# Patient Record
Sex: Female | Born: 1945 | Race: White | Hispanic: No | Smoking: Former smoker
Health system: Southern US, Community
[De-identification: ages and names within clinical notes are randomized; demographics above are authoritative.]

## PROBLEM LIST (undated history)

## (undated) DIAGNOSIS — F329 Major depressive disorder, single episode, unspecified: Secondary | ICD-10-CM

## (undated) DIAGNOSIS — F32A Depression, unspecified: Secondary | ICD-10-CM

## (undated) DIAGNOSIS — T7840XA Allergy, unspecified, initial encounter: Secondary | ICD-10-CM

## (undated) DIAGNOSIS — K219 Gastro-esophageal reflux disease without esophagitis: Secondary | ICD-10-CM

## (undated) DIAGNOSIS — M1990 Unspecified osteoarthritis, unspecified site: Secondary | ICD-10-CM

## (undated) DIAGNOSIS — R55 Syncope and collapse: Secondary | ICD-10-CM

## (undated) DIAGNOSIS — F419 Anxiety disorder, unspecified: Secondary | ICD-10-CM

## (undated) HISTORY — PX: OTHER SURGICAL HISTORY: SHX169

## (undated) HISTORY — DX: Unspecified osteoarthritis, unspecified site: M19.90

## (undated) HISTORY — DX: Depression, unspecified: F32.A

## (undated) HISTORY — DX: Anxiety disorder, unspecified: F41.9

## (undated) HISTORY — DX: Allergy, unspecified, initial encounter: T78.40XA

## (undated) HISTORY — PX: TRABECULECTOMY: SHX107

## (undated) HISTORY — DX: Syncope and collapse: R55

## (undated) HISTORY — PX: ABDOMINAL HYSTERECTOMY: SHX81

## (undated) HISTORY — DX: Gastro-esophageal reflux disease without esophagitis: K21.9

## (undated) HISTORY — PX: COSMETIC SURGERY: SHX468

---
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
## 1975-01-31 HISTORY — PX: CHOLECYSTECTOMY: SHX55

## 1988-01-31 HISTORY — PX: BREAST ENHANCEMENT SURGERY: SHX7

## 2017-01-30 HISTORY — PX: OTHER SURGICAL HISTORY: SHX169

## 2018-10-16 ENCOUNTER — Ambulatory Visit: Payer: Self-pay | Admitting: Family Medicine

## 2018-10-23 ENCOUNTER — Ambulatory Visit: Payer: Self-pay | Admitting: Family Medicine

## 2018-10-25 ENCOUNTER — Encounter: Payer: Self-pay | Admitting: Family Medicine

## 2018-10-25 ENCOUNTER — Ambulatory Visit (INDEPENDENT_AMBULATORY_CARE_PROVIDER_SITE_OTHER): Payer: Medicare Other | Admitting: Family Medicine

## 2018-10-25 ENCOUNTER — Other Ambulatory Visit: Payer: Self-pay

## 2018-10-25 VITALS — BP 134/80 | HR 74 | Temp 97.8°F | Ht 63.0 in | Wt 174.4 lb

## 2018-10-25 DIAGNOSIS — Z7689 Persons encountering health services in other specified circumstances: Secondary | ICD-10-CM

## 2018-10-25 DIAGNOSIS — I8001 Phlebitis and thrombophlebitis of superficial vessels of right lower extremity: Secondary | ICD-10-CM

## 2018-10-25 DIAGNOSIS — Z23 Encounter for immunization: Secondary | ICD-10-CM | POA: Diagnosis not present

## 2018-10-25 DIAGNOSIS — M810 Age-related osteoporosis without current pathological fracture: Secondary | ICD-10-CM

## 2018-10-25 MED ORDER — ALENDRONATE SODIUM 70 MG PO TABS: 70.0000 mg | ORAL_TABLET | ORAL | 3 refills | Status: DC

## 2018-10-25 NOTE — Patient Instructions (Signed)
Use warm or cold compress on knee/leg Wear compression stockings Stop daily aspirin for now Take ibuprofen 400-600 (2-3 tabs) twice per day with food x 5 days   Phlebitis Phlebitis is soreness and swelling (inflammation) of a vein. Follow these instructions at home: Managing pain, stiffness, and swelling  If told, apply heat to the affected area. Do this as often as told by your doctor. Use the heat source that your doctor tells you to use. This may include a moist heat pack or a heating pad. ? Place a towel between your skin and the heat source. ? Leave the heat on for 20-30 minutes. ? Take off the heat if your skin turns bright red. This is very important if you cannot feel pain, heat, or cold. You may be more likely to get burned.  Raise (elevate) the affected area above the level of your heart while you are sitting or lying down. Medicines  Take over-the-counter and prescription medicines only as told by your doctor.  If you were prescribed an antibiotic medicine, take it as told by your doctor. Do not stop taking the antibiotic even if your condition gets better.  If you take medicines to thin your blood, carry a medical alert card or wear your medical alert jewelry. General instructions   If you have phlebitis in your legs: ? Do not stand or sit for a long time. ? Keep your legs moving. ? Get up and take short walks if you have to sit for a long time. ? Try to avoid bed rest that lasts for a long time. Regular sleep is not bed rest.  Wear compression stockings as told by your doctor. These stockings help: ? To reduce swelling in your legs. ? To prevent blood clots. ? To stop the condition from coming back.  Do not use any products that contain nicotine or tobacco, such as cigarettes and e-cigarettes. If you need help quitting, ask your doctor.  Keep all follow-up visits as told by your doctor. This is important. This may include any follow-up blood tests. Contact a  doctor if:  You have strange bruises.  You have bleeding problems.  Your symptoms do not get better.  Your symptoms get worse.  You are taking medicine to treat swelling (anti-inflammatory medicine) and you get belly (abdominal) pain. Get help right away if:  You have sudden chest pain.  You suddenly have trouble breathing.  You have a fever and your symptoms get worse.  You cough up blood.  You feel dizzy or you pass out.  You have very bad pain and swelling in the affected arm or leg. These symptoms may be an emergency. Do not wait to see if the symptoms will go away. Get medical help right away. Call your local emergency services (911 in the [place] Do not drive yourself to the hospital. Summary  Phlebitis is soreness and swelling (inflammation) of a vein.  Raise (elevate) the affected area above the level of your heart while you are sitting or lying down.  If told, apply heat to the affected area. Do this as often as told by your doctor. Use the heat source that your doctor tells you to use. This may include a moist heat pack or a heating pad.  Take over-the-counter and prescription medicines only as told by your doctor. This information is not intended to replace advice given to you by your health care provider. Make sure you discuss any questions you have with your health care  provider. Document Released: 01/04/2009 Document Revised: 02/26/2018 Document Reviewed: 02/22/2016 Elsevier Patient Education  2020 [employer].

## 2018-10-25 NOTE — Progress Notes (Signed)
Cheryl Yu is a 73 y.o. female  Chief Complaint  Patient presents with  . Establish Care    phlebitis in right leg flaring up/ takes aspirin every day /wants flu shot     HPI: Cheryl Yu is a 73 y.o. female here to establish care with our office. Pt recently moved from [place] (originally from [facility]). She has 2 grown daughters and a grown son.  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 It manifests as pain in knee. Originally saw ortho and then vascular who made dx. She takes daily ASA 81mg . No h/o DVT/PE.  Pt would like a flu vaccine today.   Last colonoscopy: 2014-2015 Last PAP: 2014-2015 Last mammo: 2018  History reviewed. No pertinent past medical history.  History reviewed. No pertinent surgical history.  Social History   Socioeconomic History  . Marital status: Divorced    Spouse name: Not on file  . Number of children: Not on file  . Years of education: Not on file  . Highest education level: Not on file  Occupational History  . Not on file  Social Needs  . Financial resource strain: Not on file  . Food insecurity    Worry: Not on file    Inability: Not on file  . Transportation needs    Medical: Not on file    Non-medical: Not on file  Tobacco Use  . Smoking status: Never Smoker  . Smokeless tobacco: Never Used  Substance and Sexual Activity  . Alcohol use: Yes    Comment: occasional  . Drug use: Never  . Sexual activity: Not on file  Lifestyle  . Physical activity    Days per week: Not on file    Minutes per session: Not on file  . Stress: Not on file  Relationships  . Social Herbalist on phone: Not on file    Gets together: Not on file    Attends religious service: Not on file    Active member of club or organization: Not on file    Attends meetings of clubs or organizations: Not on file    Relationship status: Not on file  . Intimate partner violence    Fear of current or ex partner: Not on file    Emotionally abused: Not on  file    Physically abused: Not on file    Forced sexual activity: Not on file  Other Topics Concern  . Not on file  Social History Narrative  . Not on file    History reviewed. No pertinent family history.   There is no immunization history for the selected administration types on file for this patient.  No outpatient encounter medications on file as of 10/25/2018.   No facility-administered encounter medications on file as of 10/25/2018.      ROS: Gen: no fever, chills  Skin: no rash, itching ENT: no ear pain, ear drainage, nasal congestion, rhinorrhea, sinus pressure, sore throat Eyes: no blurry vision, double vision Resp: no cough, wheeze,SOB CV: no CP, palpitations, LE edema; as above in HPI GI: no heartburn, n/v/d/c, abd pain GU: no dysuria, urgency, frequency, hematuria  MSK: no joint pain, myalgias, back pain Neuro: no dizziness, headache, weakness, vertigo Psych: no depression, anxiety, insomnia   Allergies  Allergen Reactions  . Codeine Other (See Comments)  . Morphine Other (See Comments)    BP 134/80   Pulse 74   Temp 97.8 F (36.6 C) (Oral)   Ht 5'  3" (1.6 m)   Wt 174 lb 6.4 oz (79.1 kg)   SpO2 98%   BMI 30.89 kg/m   Physical Exam  Constitutional: She is oriented to person, place, and time. She appears well-developed and well-nourished. No distress.  Cardiovascular: Normal rate and regular rhythm.  Pulmonary/Chest: Effort normal and breath sounds normal.  Musculoskeletal: Normal range of motion.        General: No tenderness or edema.  Neurological: She is alert and oriented to person, place, and time.  Skin: Skin is warm and dry.  Psychiatric: She has a normal mood and affect. Her behavior is normal.     A/P:  1. Encounter to establish care with new doctor  2. Superficial phlebitis of leg, right - no sign of infection - recommended ice/heat, compression stockings, ibuprofen 400-600mg  BID w/ food x 5 days - f/u PRN  3. Need for influenza  vaccination - Flu Vaccine QUAD High Dose(Fluad)

## 2018-11-14 ENCOUNTER — Telehealth: Payer: Self-pay | Admitting: Family Medicine

## 2018-11-14 MED ORDER — BUPROPION HCL ER (SR) 100 MG PO TB12: 100.0000 mg | ORAL_TABLET | Freq: Two times a day (BID) | ORAL | 3 refills | Status: DC

## 2018-11-14 MED ORDER — FOLIC ACID 1 MG PO TABS: 1.0000 mg | ORAL_TABLET | Freq: Every day | ORAL | 3 refills | Status: DC

## 2018-11-14 MED ORDER — CITALOPRAM HYDROBROMIDE 40 MG PO TABS: 40.0000 mg | ORAL_TABLET | Freq: Every day | ORAL | 3 refills | Status: DC

## 2018-11-14 MED ORDER — FAMOTIDINE 20 MG PO TABS: 20.0000 mg | ORAL_TABLET | Freq: Two times a day (BID) | ORAL | 3 refills | Status: DC

## 2018-11-14 NOTE — Telephone Encounter (Signed)
Pt is aware of rx refills and change.

## 2018-11-14 NOTE — Telephone Encounter (Signed)
All meds refilled with exception of zantac as it hs been recalled and pt is take pepcid which is from same family/class of med as zantac

## 2018-11-14 NOTE — Telephone Encounter (Signed)
rx refill ranitidie (ZANTC) 150MG  capsule Folic acid (FOLVITE) 332 MG Famotidine (PECID) 20 MG Citalpram ( CELEXA) 40 mg BuRPOion (wellbutin SR) 100mg    [facility] [id number] [place] [address] [phone] (Phone) [phone] (Fax)

## 2019-02-17 ENCOUNTER — Telehealth: Payer: Self-pay | Admitting: Family Medicine

## 2019-02-17 DIAGNOSIS — F329 Major depressive disorder, single episode, unspecified: Secondary | ICD-10-CM

## 2019-02-17 DIAGNOSIS — F32A Depression, unspecified: Secondary | ICD-10-CM

## 2019-02-17 NOTE — Telephone Encounter (Signed)
I called Cheryl Yu back to inform her that Dr. Salena Saner will be in office until Wednesday.  Cheryl Yu was ok with that, she said that the medication is 180$.  Please advise.

## 2019-02-17 NOTE — Telephone Encounter (Signed)
Patient is calling and stated that the Bupropion was to expensive and if she could get something more affordable. CB [phone].

## 2019-02-19 NOTE — Telephone Encounter (Signed)
I did not Rx wellbutrin, just refilled for pt after she established care with me in fall 2020. She has been on this medication. Did insurance change? Can PA be done so med can be covered and she can continue on med regimen that has been effective for her?

## 2019-02-20 NOTE — Telephone Encounter (Signed)
Thank you for the info and for submitting the PA

## 2019-02-20 NOTE — Telephone Encounter (Signed)
I spoke with pt and she informed me that her insurance did change.  I got the new insurance information over the phone and will submit a PA in regards to Rx discussed.

## 2019-02-21 DIAGNOSIS — F32A Depression, unspecified: Secondary | ICD-10-CM | POA: Insufficient documentation

## 2019-02-21 DIAGNOSIS — F329 Major depressive disorder, single episode, unspecified: Secondary | ICD-10-CM | POA: Insufficient documentation

## 2019-02-21 NOTE — Telephone Encounter (Signed)
F32.9 Thank you

## 2019-02-21 NOTE — Telephone Encounter (Signed)
I need a ICD 10 code for PA.  Please advise, thank you.

## 2019-03-19 ENCOUNTER — Ambulatory Visit (INDEPENDENT_AMBULATORY_CARE_PROVIDER_SITE_OTHER): Payer: Medicare Other | Admitting: *Deleted

## 2019-03-19 ENCOUNTER — Encounter: Payer: Self-pay | Admitting: *Deleted

## 2019-03-19 DIAGNOSIS — Z0000 Encounter for general adult medical examination without abnormal findings: Secondary | ICD-10-CM

## 2019-03-19 NOTE — Patient Instructions (Signed)
See you next year!  Continue to eat heart healthy diet (full of fruits, vegetables, whole grains, lean protein, water--limit salt, fat, and sugar intake) and increase physical activity as tolerated.  Continue doing brain stimulating activities (puzzles, reading, adult coloring books, staying active) to keep memory sharp.    Cheryl Yu , Thank you for taking time to come for your Medicare Wellness Visit. I appreciate your ongoing commitment to your health goals. Please review the following plan we discussed and let me know if I can assist you in the future.   These are the goals we discussed: Goals    . Increase physical activity       This is a list of the screening recommended for you and due dates:  Health Maintenance  Topic Date Due  .  Hepatitis C: One time screening is recommended by Center for Disease Control  (CDC) for  adults born from [birth year] through 1965.   [birth year]  . Tetanus Vaccine  01/31/1964  . Mammogram  01/31/1995  . Colon Cancer Screening  01/31/1995  . DEXA scan (bone density measurement)  01/30/2010  . Pneumonia vaccines (1 of 2 - PCV13) 01/30/2010  . Flu Shot  Completed    Preventive Care 65 Years and Older, Female Preventive care refers to lifestyle choices and visits with your health care provider that can promote health and wellness. This includes:  A yearly physical exam. This is also called an annual well check.  Regular dental and eye exams.  Immunizations.  Screening for certain conditions.  Healthy lifestyle choices, such as diet and exercise. What can I expect for my preventive care visit? Physical exam Your health care provider will check:  Height and weight. These may be used to calculate body mass index (BMI), which is a measurement that tells if you are at a healthy weight.  Heart rate and blood pressure.  Your skin for abnormal spots. Counseling Your health care provider may ask you questions about:  Alcohol, tobacco, and drug  use.  Emotional well-being.  Home and relationship well-being.  Sexual activity.  Eating habits.  History of falls.  Memory and ability to understand (cognition).  Work and work Statistician.  Pregnancy and menstrual history. What immunizations do I need?  Influenza (flu) vaccine  This is recommended every year. Tetanus, diphtheria, and pertussis (Tdap) vaccine  You may need a Td booster every 10 years. Varicella (chickenpox) vaccine  You may need this vaccine if you have not already been vaccinated. Zoster (shingles) vaccine  You may need this after age 74. Pneumococcal conjugate (PCV13) vaccine  One dose is recommended after age 74. Pneumococcal polysaccharide (PPSV23) vaccine  One dose is recommended after age 74. Measles, mumps, and rubella (MMR) vaccine  You may need at least one dose of MMR if you were born in 1957 or later. You may also need a second dose. Meningococcal conjugate (MenACWY) vaccine  You may need this if you have certain conditions. Hepatitis A vaccine  You may need this if you have certain conditions or if you travel or work in places where you may be exposed to hepatitis A. Hepatitis B vaccine  You may need this if you have certain conditions or if you travel or work in places where you may be exposed to hepatitis B. Haemophilus influenzae type b (Hib) vaccine  You may need this if you have certain conditions. You may receive vaccines as individual doses or as more than one vaccine together in one shot (combination  vaccines). Talk with your health care provider about the risks and benefits of combination vaccines. What tests do I need? Blood tests  Lipid and cholesterol levels. These may be checked every 5 years, or more frequently depending on your overall health.  Hepatitis C test.  Hepatitis B test. Screening  Lung cancer screening. You may have this screening every year starting at age 74 if you have a 30-pack-year history of  smoking and currently smoke or have quit within the past 15 years.  Colorectal cancer screening. All adults should have this screening starting at age 74 and continuing until age 71. Your health care provider may recommend screening at age 74 if you are at increased risk. You will have tests every 1-10 years, depending on your results and the type of screening test.  Diabetes screening. This is done by checking your blood sugar (glucose) after you have not eaten for a while (fasting). You may have this done every 1-3 years.  Mammogram. This may be done every 1-2 years. Talk with your health care provider about how often you should have regular mammograms.  BRCA-related cancer screening. This may be done if you have a family history of breast, ovarian, tubal, or peritoneal cancers. Other tests  Sexually transmitted disease (STD) testing.  Bone density scan. This is done to screen for osteoporosis. You may have this done starting at age 74. Follow these instructions at home: Eating and drinking  Eat a diet that includes fresh fruits and vegetables, whole grains, lean protein, and low-fat dairy products. Limit your intake of foods with high amounts of sugar, saturated fats, and salt.  Take vitamin and mineral supplements as recommended by your health care provider.  Do not drink alcohol if your health care provider tells you not to drink.  If you drink alcohol: ? Limit how much you have to 0-1 drink a day. ? Be aware of how much alcohol is in your drink. In the [place] one drink equals one 12 oz bottle of beer (355 mL), one 5 oz glass of wine (148 mL), or one 1 oz glass of hard liquor (44 mL). Lifestyle  Take daily care of your teeth and gums.  Stay active. Exercise for at least 30 minutes on 5 or more days each week.  Do not use any products that contain nicotine or tobacco, such as cigarettes, e-cigarettes, and chewing tobacco. If you need help quitting, ask your health care  provider.  If you are sexually active, practice safe sex. Use a condom or other form of protection in order to prevent STIs (sexually transmitted infections).  Talk with your health care provider about taking a low-dose aspirin or statin. What's next?  Go to your health care provider once a year for a well check visit.  Ask your health care provider how often you should have your eyes and teeth checked.  Stay up to date on all vaccines. This information is not intended to replace advice given to you by your health care provider. Make sure you discuss any questions you have with your health care provider. Document Revised: 01/10/2018 Document Reviewed: 01/10/2018 Elsevier Patient Education  2020 [employer].

## 2019-03-19 NOTE — Progress Notes (Signed)
Virtual Visit via Audio Note  I connected with patient on 03/19/19 at  1:45 PM EST by audio enabled telemedicine application and verified that I am speaking with the correct person using two identifiers.   THIS ENCOUNTER IS A VIRTUAL VISIT DUE TO COVID-19 - PATIENT WAS NOT SEEN IN THE OFFICE. PATIENT HAS CONSENTED TO VIRTUAL VISIT / TELEMEDICINE VISIT   Location of patient: home  Location of provider: office  I discussed the limitations of evaluation and management by telemedicine and the availability of in person appointments. The patient expressed understanding and agreed to proceed.   Subjective:   Cheryl Yu is a 74 y.o. female who presents for an Initial Medicare Annual Wellness Visit.  The Patient was informed that the wellness visit is to identify future health risk and educate and initiate measures that can reduce risk for increased disease through the lifespan.   Describes health as fair, good or great? Good  Keep her 60 month old great grandson daily.  Pt also enjoys knitting.   Review of Systems    Home Safety/Smoke Alarms: Feels safe in home. Smoke alarms in place.  Lives w/ dtr and granddaughter. 2 story home. Stays mainly on 1 level.   Female:     Mammo- declines  today     Dexa scan- declines today [facility]-pt  States she will discuss w/ PCP at next visit    Objective:     Advanced Directives 03/19/2019  Does Patient Have a Medical Advance Directive? No  Would patient like information on creating a medical advance directive? No - Patient declined    Current Medications (verified) Outpatient Encounter Medications as of 03/19/2019  Medication Sig  . alendronate (FOSAMAX) 70 MG tablet Take 1 tablet (70 mg total) by mouth every 7 (seven) days. Take with a full glass of water on an empty stomach.  . ASPIRIN 81 PO Take by mouth.  Marland Kitchen buPROPion (WELLBUTRIN SR) 100 MG 12 hr tablet Take 1 tablet (100 mg total) by mouth 2 (two) times daily.  . citalopram (CELEXA) 40 MG  tablet Take 1 tablet (40 mg total) by mouth daily.  . famotidine (PEPCID) 20 MG tablet Take 1 tablet (20 mg total) by mouth 2 (two) times daily.  . folic acid (FOLVITE) 1 MG tablet Take 1 tablet (1 mg total) by mouth daily.  . [DISCONTINUED] ranitidine (ZANTAC) 150 MG capsule Take 150 mg by mouth daily.   No facility-administered encounter medications on file as of 03/19/2019.    Allergies (verified) Codeine and Morphine   History: Past Medical History:  Diagnosis Date  . Allergy   . Arthritis   . Depression   . GERD (gastroesophageal reflux disease)   . Syncope    Past Surgical History:  Procedure Laterality Date  . ABDOMINAL HYSTERECTOMY     Took out Uterus  . BREAST ENHANCEMENT SURGERY  1990  . CHOLECYSTECTOMY  1977  . complete bladder reconstruction    . 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 Arthritis Mother   . Cancer Mother   . Hearing loss Mother   . Stroke Mother   . Cancer Father   . Cancer Sister   . Early death Sister   . Kidney disease Sister   . Depression Daughter   . Alcohol abuse Son   . Depression Son   . Diabetes Son   . Arthritis Maternal Grandmother   .  Heart disease Maternal Grandmother   . Cancer Maternal Grandfather   . Early death Maternal Grandfather    Social History   Socioeconomic History  . Marital status: Divorced    Spouse name: Not on file  . Number of children: Not on file  . Years of education: Not on file  . Highest education level: Not on file  Occupational History  . Not on file  Tobacco Use  . Smoking status: Never Smoker  . Smokeless tobacco: Never Used  Substance and Sexual Activity  . Alcohol use: Yes    Comment: 1 glass wine daily  . Drug use: Never  . Sexual activity: Not on file  Other Topics Concern  . Not on file  Social History Narrative  . Not on file   Social Determinants of Health   Financial Resource Strain:   . Difficulty of  Paying Living Expenses: Not on file  Food Insecurity:   . Worried About Programme researcher, broadcasting/film/video in the Last Year: Not on file  . Ran Out of Food in the Last Year: Not on file  Transportation Needs:   . Lack of Transportation (Medical): Not on file  . Lack of Transportation (Non-Medical): Not on file  Physical Activity:   . Days of Exercise per Week: Not on file  . Minutes of Exercise per Session: Not on file  Stress:   . Feeling of Stress : Not on file  Social Connections:   . Frequency of Communication with Friends and Family: Not on file  . Frequency of Social Gatherings with Friends and Family: Not on file  . Attends Religious Services: Not on file  . Active Member of Clubs or Organizations: Not on file  . Attends Banker Meetings: Not on file  . Marital Status: Not on file    Tobacco Counseling Counseling given: Not Answered   Clinical Intake: Pain : No/denies pain     Activities of Daily Living In your present state of health, do you have any difficulty performing the following activities: 03/19/2019  Hearing? N  Vision? N  Difficulty concentrating or making decisions? N  Walking or climbing stairs? N  Dressing or bathing? N  Doing errands, shopping? N  Preparing Food and eating ? N  Using the Toilet? N  In the past six months, have you accidently leaked urine? N  Do you have problems with loss of bowel control? N  Managing your Medications? N  Managing your Finances? N  Housekeeping or managing your Housekeeping? N     Immunizations and Health Maintenance Immunization History  Administered Date(s) Administered  . Fluad Quad(high Dose 65+) 10/25/2018   Health Maintenance Due  Topic Date Due  . Hepatitis C Screening  [birth year]  . TETANUS/TDAP  01/31/1964  . MAMMOGRAM  01/31/1995  . COLONOSCOPY  01/31/1995  . DEXA SCAN  01/30/2010  . PNA vac Low Risk Adult (1 of 2 - PCV13) 01/30/2010    Patient Care Team: Overton Mam, DO as PCP -  General (Family Medicine)  Indicate any recent Medical Services you may have received from other than [facility] providers in the past year (date may be approximate).     Assessment:   This is a routine wellness examination for [name]. Physical assessment deferred to PCP.  Hearing/Vision screen Unable to assess. This visit is enabled though telemedicine due to Covid 19.   Dietary issues and exercise activities discussed: Current Exercise Habits: The patient does not participate in regular exercise  at present, Exercise limited by: None identified Diet (meal preparation, eat out, water intake, caffeinated beverages, dairy products, fruits and vegetables): low carb diet. Reports she drinks a lot of water.  Goals    . Increase physical activity      Depression Screen PHQ 2/9 Scores 03/19/2019  PHQ - 2 Score 0    Fall Risk Fall Risk  03/19/2019  Falls in the past year? 0  Number falls in past yr: 0  Injury with Fall? 0  Follow up Education provided;Falls prevention discussed     Cognitive Function:   Ad8 score reviewed for issues:  Issues making decisions:no  Less interest in hobbies / activities:no  Repeats questions, stories (family complaining):no  Trouble using ordinary gadgets (microwave, computer, phone):no  Forgets the month or year: no  Mismanaging finances: no  Remembering appts:no  Daily problems with thinking and/or memory:no Ad8 score is=0       Screening Tests Health Maintenance  Topic Date Due  . Hepatitis C Screening  06-11-1945  . TETANUS/TDAP  01/31/1964  . MAMMOGRAM  01/31/1995  . COLONOSCOPY  01/31/1995  . DEXA SCAN  01/30/2010  . PNA vac Low Risk Adult (1 of 2 - PCV13) 01/30/2010  . INFLUENZA VACCINE  Completed      Plan:   See you next year!  Continue to eat heart healthy diet (full of fruits, vegetables, whole grains, lean protein, water--limit salt, fat, and sugar intake) and increase physical activity as tolerated.  Continue doing  brain stimulating activities (puzzles, reading, adult coloring books, staying active) to keep memory sharp.    I have personally reviewed and noted the following in the patient's chart:   . Medical and social history . Use of alcohol, tobacco or illicit drugs  . Current medications and supplements . Functional ability and status . Nutritional status . Physical activity . Advanced directives . List of other physicians . Hospitalizations, surgeries, and ER visits in previous 12 months . Vitals . Screenings to include cognitive, depression, and falls . Referrals and appointments  In addition, I have reviewed and discussed with patient certain preventive protocols, quality metrics, and best practice recommendations. A written personalized care plan for preventive services as well as general preventive health recommendations were provided to patient.     Mady Haagensen [place], [place]   03/19/2019

## 2019-10-16 ENCOUNTER — Other Ambulatory Visit: Payer: Self-pay | Admitting: Family Medicine

## 2019-10-16 DIAGNOSIS — M810 Age-related osteoporosis without current pathological fracture: Secondary | ICD-10-CM

## 2019-11-13 ENCOUNTER — Other Ambulatory Visit: Payer: Self-pay | Admitting: Family Medicine

## 2019-11-17 ENCOUNTER — Other Ambulatory Visit: Payer: Self-pay | Admitting: Family Medicine

## 2019-11-17 ENCOUNTER — Other Ambulatory Visit: Payer: Self-pay

## 2019-11-17 NOTE — Telephone Encounter (Signed)
Pharmacy did not receive medication Friday, 11/14/19, due to E-scribe system being down.  Please resend.

## 2019-11-19 ENCOUNTER — Other Ambulatory Visit: Payer: Self-pay

## 2019-11-20 ENCOUNTER — Ambulatory Visit (INDEPENDENT_AMBULATORY_CARE_PROVIDER_SITE_OTHER): Payer: Medicare Other | Admitting: Family Medicine

## 2019-11-20 VITALS — BP 130/84 | HR 87 | Temp 98.0°F | Ht 63.0 in | Wt 172.0 lb

## 2019-11-20 DIAGNOSIS — M79621 Pain in right upper arm: Secondary | ICD-10-CM

## 2019-11-20 DIAGNOSIS — Z23 Encounter for immunization: Secondary | ICD-10-CM

## 2019-11-20 DIAGNOSIS — M810 Age-related osteoporosis without current pathological fracture: Secondary | ICD-10-CM | POA: Diagnosis not present

## 2019-11-20 DIAGNOSIS — M26621 Arthralgia of right temporomandibular joint: Secondary | ICD-10-CM

## 2019-11-20 DIAGNOSIS — M7521 Bicipital tendinitis, right shoulder: Secondary | ICD-10-CM

## 2019-11-20 DIAGNOSIS — F32A Depression, unspecified: Secondary | ICD-10-CM

## 2019-11-20 DIAGNOSIS — R12 Heartburn: Secondary | ICD-10-CM

## 2019-11-20 MED ORDER — IBUPROFEN 600 MG PO TABS: 600.0000 mg | ORAL_TABLET | Freq: Two times a day (BID) | ORAL | 0 refills | Status: DC | PRN

## 2019-11-20 MED ORDER — FAMOTIDINE 20 MG PO TABS: 20.0000 mg | ORAL_TABLET | Freq: Two times a day (BID) | ORAL | 3 refills | Status: DC

## 2019-11-20 MED ORDER — ALENDRONATE SODIUM 70 MG PO TABS: 70.0000 mg | ORAL_TABLET | ORAL | 3 refills | Status: DC

## 2019-11-20 MED ORDER — CITALOPRAM HYDROBROMIDE 40 MG PO TABS: 40.0000 mg | ORAL_TABLET | Freq: Every day | ORAL | 3 refills | Status: DC

## 2019-11-20 MED ORDER — BUPROPION HCL ER (SR) 100 MG PO TB12: 100.0000 mg | ORAL_TABLET | Freq: Two times a day (BID) | ORAL | 3 refills | Status: DC

## 2019-11-20 NOTE — Progress Notes (Signed)
Cheryl Yu is a 74 y.o. female  Chief Complaint  Patient presents with  . Follow-up    f/u meds. wants flu shot today    HPI: Cheryl Yu is a 74 y.o. female who schedule appt for med refills but presents with multiple other concerns/complaints:  1. Rt jaw/TMJ pain x few months. Pain when she opens/closes her mouth, chews.  Pt clenches and grinds teeth. No OTC meds or interventions  2. Lt upper arm "burning" x 8 mo - posterolateral aspect of upper arm, not getting worse. Intermittent and occurs a few times per week, lasts a few seconds.   3. Rt upper arm pain - mostly when lifting in front of her, reaching overhead. No injury or trauma that she can recall. symptoms a few months. Ache is always present to some extent, worse some days than others. No numbness or tinglings. No weakness.   4. Needs refills of all meds  5. She would like flu vaccine today.  Past Medical History:  Diagnosis Date  . Allergy   . Arthritis   . Depression   . GERD (gastroesophageal reflux disease)   . Syncope     Past Surgical History:  Procedure Laterality Date  . ABDOMINAL HYSTERECTOMY     Took out Uterus  . BREAST ENHANCEMENT SURGERY  1990  . CHOLECYSTECTOMY  1977  . complete bladder reconstruction    . 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 Marital status: Divorced    Spouse name: Not on file  . Number of children: Not on file  . Years of education: Not on file  . Highest education level: Not on file  Occupational History  . Not on file  Tobacco Use  . Smoking status: Never Smoker  . Smokeless tobacco: Never Used  Substance and Sexual Activity  . Alcohol use: Yes    Comment: 1 glass wine daily  . Drug use: Never  . Sexual activity: Not on file  Other Topics Concern  . Not on file  Social History Narrative  . Not on file   Social Determinants of Health   Financial Resource Strain:   . Difficulty of Paying  Living Expenses: Not on file  Food Insecurity:   . Worried About Programme researcher, broadcasting/film/video in the Last Year: Not on file  . Ran Out of Food in the Last Year: Not on file  Transportation Needs:   . Lack of Transportation (Medical): Not on file  . Lack of Transportation (Non-Medical): Not on file  Physical Activity:   . Days of Exercise per Week: Not on file  . Minutes of Exercise per Session: Not on file  Stress:   . Feeling of Stress : Not on file  Social Connections:   . Frequency of Communication with Friends and Family: Not on file  . Frequency of Social Gatherings with Friends and Family: Not on file  . Attends Religious Services: Not on file  . Active Member of Clubs or Organizations: Not on file  . Attends Banker Meetings: Not on file  . Marital Status: Not on file  Intimate Partner Violence:   . Fear of Current or Ex-Partner: Not on file  . Emotionally Abused: Not on file  . Physically Abused: Not on file  . Sexually Abused: Not on file    Family History  Problem Relation Age of Onset  . Arthritis  Mother   . Cancer Mother   . Hearing loss Mother   . Stroke Mother   . Cancer Father   . Cancer Sister   . Early death Sister   . Kidney disease Sister   . Depression Daughter   . Alcohol abuse Son   . Depression Son   . Diabetes Son   . Arthritis Maternal Grandmother   . Heart disease Maternal Grandmother   . Cancer Maternal Grandfather   . Early death Maternal Grandfather      Immunization History  Administered Date(s) Administered  . Fluad Quad(high Dose 65+) 10/25/2018, 11/20/2019  . PFIZER SARS-COV-2 Vaccination 06/07/2019, 06/26/2019    Outpatient Encounter Medications as of 11/20/2019  Medication Sig  . alendronate (FOSAMAX) 70 MG tablet TAKE 1 TABLET BY MOUTH EVERY 7 (SEVEN) DAYS. TAKE WITH A FULL GLASS OF WATER ON AN EMPTY STOMACH.  Marland Kitchen ASPIRIN 81 PO Take by mouth.  Marland Kitchen buPROPion (WELLBUTRIN SR) 100 MG 12 hr tablet TAKE 1 TABLET BY MOUTH TWICE A  DAY  . citalopram (CELEXA) 40 MG tablet TAKE 1 TABLET BY MOUTH EVERY DAY  . famotidine (PEPCID) 20 MG tablet TAKE 1 TABLET BY MOUTH TWICE A DAY  . folic acid (FOLVITE) 1 MG tablet TAKE 1 TABLET BY MOUTH EVERY DAY   No facility-administered encounter medications on file as of 11/20/2019.     ROS: Pertinent positives and negatives noted in HPI. Remainder of ROS non-contributory   Allergies  Allergen Reactions  . Codeine Other (See Comments)  . Morphine Other (See Comments)    BP 130/84   Pulse 87   Temp 98 F (36.7 C) (Temporal)   Ht 5\' 3"  (1.6 m)   Wt 172 lb (78 kg)   SpO2 96%   BMI 30.47 kg/m   Physical Exam Constitutional:      General: She is not in acute distress.    Appearance: Normal appearance. She is not ill-appearing.  HENT:     Head:     Jaw: Tenderness (TTP over Rt TMJ) and pain on movement (Rt TMJ) present.     Salivary Glands: Right salivary gland is not diffusely enlarged or tender. Left salivary gland is not diffusely enlarged.     Right Ear: Tympanic membrane, ear canal and external ear normal.     Left Ear: Tympanic membrane, ear canal and external ear normal.  Cardiovascular:     Rate and Rhythm: Normal rate and regular rhythm.     Pulses: Normal pulses.  Pulmonary:     Effort: Pulmonary effort is normal.     Breath sounds: Normal breath sounds. No stridor. No wheezing or rhonchi.  Musculoskeletal:     Right shoulder: No tenderness, bony tenderness or crepitus. Normal range of motion.     Right upper arm: Tenderness present. No bony tenderness.     Cervical back: Normal range of motion and neck supple.     Right lower leg: No edema.     Left lower leg: No edema.  Lymphadenopathy:     Cervical: No cervical adenopathy.  Neurological:     Mental Status: She is alert and oriented to person, place, and time.  Psychiatric:        Mood and Affect: Mood normal.        Behavior: Behavior normal.      A/P:  1. Need for influenza vaccination - Flu  Vaccine QUAD High Dose(Fluad)  2. 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 Take with a full glass of water on an empty stomach.  Dispense: 12 tablet; Refill: 3  3. Depression, unspecified depression type - well-controlled, at baseline Refill: - buPROPion (WELLBUTRIN SR) 100 MG 12 hr tablet; Take 1 tablet (100 mg total) by mouth 2 (two) times daily.  Dispense: 180 tablet; Refill: 3 - citalopram (CELEXA) 40 MG tablet; Take 1 tablet (40 mg total) by mouth daily.  Dispense: 90 tablet; Refill: 3  4. Heartburn - controlled Refill: - famotidine (PEPCID) 20 MG tablet; Take 1 tablet (20 mg total) by mouth 2 (two) times daily.  Dispense: 180 tablet; Refill: 3  5. TMJ tenderness, right - ice pack BID - ibuprofen 600mg  BID with food x 5-7 days - f/u with dentist to see if mouth guard is needed/could be beneficial  6. Pain in right upper arm 7. Biceps tendonitis on right - ice BID, rest, ibuprofen 600mg  BID w/ food x 5-7 days - if no or minimal improvement, PT referral and possible cortisone injection   This visit occurred during the SARS-CoV-2 public health emergency.  Safety protocols were in place, including screening questions prior to the visit, additional usage of staff PPE, and extensive cleaning of exam room while observing appropriate contact time as indicated for disinfecting solutions.

## 2019-11-21 ENCOUNTER — Encounter: Payer: Self-pay | Admitting: Family Medicine

## 2020-02-25 ENCOUNTER — Telehealth: Payer: Self-pay | Admitting: Family Medicine

## 2020-02-25 DIAGNOSIS — F32A Depression, unspecified: Secondary | ICD-10-CM

## 2020-02-25 DIAGNOSIS — R12 Heartburn: Secondary | ICD-10-CM

## 2020-02-25 DIAGNOSIS — M810 Age-related osteoporosis without current pathological fracture: Secondary | ICD-10-CM

## 2020-02-25 MED ORDER — ALENDRONATE SODIUM 70 MG PO TABS: 70.0000 mg | ORAL_TABLET | ORAL | 2 refills | Status: DC

## 2020-02-25 MED ORDER — BUPROPION HCL ER (SR) 100 MG PO TB12: 100.0000 mg | ORAL_TABLET | Freq: Two times a day (BID) | ORAL | 2 refills | Status: DC

## 2020-02-25 MED ORDER — CITALOPRAM HYDROBROMIDE 40 MG PO TABS: 40.0000 mg | ORAL_TABLET | Freq: Every day | ORAL | 2 refills | Status: DC

## 2020-02-25 MED ORDER — FAMOTIDINE 20 MG PO TABS: 20.0000 mg | ORAL_TABLET | Freq: Two times a day (BID) | ORAL | 2 refills | Status: DC

## 2020-02-25 NOTE — Telephone Encounter (Signed)
Caller Name: Helaina Stefano  Call back phone #: [phone]  MEDICATION(S): new pharmacy plan and has to use mail order pharmacy - they advised pt new RXs needed for 3 month supply - pt has only 7 days on hand - pt request RX be sent today 1/26 & that CMA call to notify her when sent alendronate (FOSAMAX) 70 MG tablet buPROPion (WELLBUTRIN SR) 100 MG 12 hr tablet  citalopram (CELEXA) 40 MG tablet  famotidine (PEPCID) 20 MG tablet    Has the patient contacted their pharmacy? yes  Preferred Pharmacy:  [facility] - [place], [place] - [address] Phone:  [phone]  Fax:  [phone]

## 2020-02-25 NOTE — Telephone Encounter (Signed)
RX's sent to the mail order and patient notified VIA phone. Dm/cma

## 2020-03-22 NOTE — Progress Notes (Unsigned)
Subjective:   Cheryl Yu is a 75 y.o. female who presents for Medicare Annual (Subsequent) preventive examination.  I connected with [facility] today by telephone and verified that I am speaking with the correct person using two identifiers. Location patient: home Location provider: work Persons participating in the virtual visit: patient, Engineer, civil (consulting).    I discussed the limitations, risks, security and privacy concerns of performing an evaluation and management service by telephone and the availability of in person appointments. I also discussed with the patient that there may be a patient responsible charge related to this service. The patient expressed understanding and verbally consented to this telephonic visit.    Interactive audio and video telecommunications were attempted between this provider and patient, however failed, due to patient having technical difficulties OR patient did not have access to video capability.  We continued and completed visit with audio only.  Some vital signs may be absent or patient reported.   Time Spent with patient on telephone encounter: 30 minutes    Review of Systems     Cardiac Risk Factors include: advanced age (>90men, >31 women)     Objective:    Today's Vitals   03/23/20 0817  Weight: 165 lb (74.8 kg)  Height: 5\' 3"  (1.6 m)  PainSc: 3    Body mass index is 29.23 kg/m.  Advanced Directives 03/23/2020 03/19/2019  Does Patient Have a Medical Advance Directive? Yes No  Does patient want to make changes to medical advance directive? Yes (MAU/Ambulatory/Procedural Areas - Information given) -  Would patient like information on creating a medical advance directive? - No - Patient declined    Current Medications (verified) Outpatient Encounter Medications as of 03/23/2020  Medication Sig  . alendronate (FOSAMAX) 70 MG tablet Take 1 tablet (70 mg total) by mouth once a week. Take with a full glass of water on an empty stomach.  . ASPIRIN 81  PO Take by mouth.  03/25/2020 buPROPion (WELLBUTRIN SR) 100 MG 12 hr tablet Take 1 tablet (100 mg total) by mouth 2 (two) times daily.  . citalopram (CELEXA) 40 MG tablet Take 1 tablet (40 mg total) by mouth daily.  . famotidine (PEPCID) 20 MG tablet Take 1 tablet (20 mg total) by mouth 2 (two) times daily.  . folic acid (FOLVITE) 1 MG tablet TAKE 1 TABLET BY MOUTH EVERY DAY  . ibuprofen (ADVIL) 600 MG tablet Take 1 tablet (600 mg total) by mouth 2 (two) times daily as needed.   No facility-administered encounter medications on file as of 03/23/2020.    Allergies (verified) Codeine and Morphine   History: Past Medical History:  Diagnosis Date  . Allergy   . Arthritis   . Depression   . GERD (gastroesophageal reflux disease)   . Syncope    Past Surgical History:  Procedure Laterality Date  . ABDOMINAL HYSTERECTOMY     Took out Uterus  . BREAST ENHANCEMENT SURGERY  1990  . CHOLECYSTECTOMY  1977  . complete bladder reconstruction    . 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 Arthritis Mother   . Cancer Mother   . Hearing loss Mother   . Stroke Mother   . Cancer Father   . Cancer Sister   . Early death Sister   . Kidney disease Sister   . Depression Daughter   . Alcohol abuse Son   . Depression Son   .  Diabetes Son   . Arthritis Maternal Grandmother   . Heart disease Maternal Grandmother   . Cancer Maternal Grandfather   . Early death Maternal Grandfather    Social History   Socioeconomic History  . Marital status: Divorced    Spouse name: Not on file  . Number of children: Not on file  . Years of education: Not on file  . Highest education level: Not on file  Occupational History  . Not on file  Tobacco Use  . Smoking status: Never Smoker  . Smokeless tobacco: Never Used  Substance and Sexual Activity  . Alcohol use: Yes    Comment: 1 glass wine daily  . Drug use: Never  . Sexual activity: Not on file   Other Topics Concern  . Not on file  Social History Narrative  . Not on file   Social Determinants of Health   Financial Resource Strain: Low Risk   . Difficulty of Paying Living Expenses: Not hard at all  Food Insecurity: No Food Insecurity  . Worried About Programme researcher, broadcasting/film/videounning Out of Food in the Last Year: Never true  . Ran Out of Food in the Last Year: Never true  Transportation Needs: No Transportation Needs  . Lack of Transportation (Medical): No  . Lack of Transportation (Non-Medical): No  Physical Activity: Inactive  . Days of Exercise per Week: 0 days  . Minutes of Exercise per Session: 0 min  Stress: No Stress Concern Present  . Feeling of Stress : Not at all  Social Connections: Socially Isolated  . Frequency of Communication with Friends and Family: More than three times a week  . Frequency of Social Gatherings with Friends and Family: More than three times a week  . Attends Religious Services: Never  . Active Member of Clubs or Organizations: No  . Attends BankerClub or Organization Meetings: Never  . Marital Status: Divorced    Tobacco Counseling Counseling given: Not Answered   Clinical Intake:  Pre-visit preparation completed: Yes  Pain : 0-10 Pain Score: 3  Pain Type: Chronic pain Pain Location: Shoulder Pain Orientation: Right Pain Onset: More than a month ago Pain Frequency: Intermittent     Nutritional Status: BMI 25 -29 Overweight Nutritional Risks: None Diabetes: No  How often do you need to have someone help you when you read instructions, pamphlets, or other written materials from your doctor or pharmacy?: 1 - Never  Diabetic?No  Interpreter Needed?: No  Information entered by :: Thomasenia SalesMartha Stanley LPN   Activities of Daily Living In your present state of health, do you have any difficulty performing the following activities: 03/23/2020  Hearing? N  Vision? N  Difficulty concentrating or making decisions? Y  Comment occasionally  Walking or climbing  stairs? N  Dressing or bathing? N  Doing errands, shopping? N  Preparing Food and eating ? N  Using the Toilet? N  In the past six months, have you accidently leaked urine? N  Do you have problems with loss of bowel control? N  Managing your Medications? N  Managing your Finances? N  Housekeeping or managing your Housekeeping? N  Some recent data might be hidden    Patient Care Team: Overton Mamirigliano, Mary K, DO as PCP - General (Family Medicine)  Indicate any recent Medical Services you may have received from other than [facility] providers in the past year (date may be approximate).     Assessment:   This is a routine wellness examination for Cheryl Yu.  Hearing/Vision screen  Hearing  Screening   125Hz  250Hz  500Hz  1000Hz  2000Hz  3000Hz  4000Hz  6000Hz  8000Hz   Right ear:           Left ear:           Comments: C/O Mild hearing loss  Vision Screening Comments: Wears glasses Last eye exam-2020  Dietary issues and exercise activities discussed: Current Exercise Habits: The patient does not participate in regular exercise at present, Exercise limited by: None identified  Goals    . Increase physical activity    . Patient Stated     Eat healthier & drink more water      Depression Screen PHQ 2/9 Scores 03/23/2020 03/19/2019  PHQ - 2 Score 0 0    Fall Risk Fall Risk  03/23/2020 03/19/2019  Falls in the past year? 0 0  Number falls in past yr: 0 0  Injury with Fall? 0 0  Follow up Falls prevention discussed Education provided;Falls prevention discussed    FALL RISK PREVENTION PERTAINING TO THE HOME:  Any stairs in or around the home? Yes  If so, are there any without handrails? No  Home free of loose throw rugs in walkways, pet beds, electrical cords, etc? Yes  Adequate lighting in your home to reduce risk of falls? Yes   ASSISTIVE DEVICES UTILIZED TO PREVENT FALLS:  Life alert? No  Use of a cane, walker or w/c? No  Grab bars in the bathroom? No  Shower chair or bench in shower?  No  Elevated toilet seat or a handicapped toilet? No   TIMED UP AND GO:  Was the test performed? No . Phone visit   Cognitive Function:Normal cognitive status assessed by this Nurse Health Advisor. No abnormalities found.          Immunizations Immunization History  Administered Date(s) Administered  . Fluad Quad(high Dose 65+) 10/25/2018, 11/20/2019  . PFIZER(Purple Top)SARS-COV-2 Vaccination 06/07/2019, 06/26/2019    TDAP status: Due, Education has been provided regarding the importance of this vaccine. Advised may receive this vaccine at local pharmacy or Health Dept. Aware to provide a copy of the vaccination record if obtained from local pharmacy or Health Dept. Verbalized acceptance and understanding.  Flu Vaccine status: Up to date  Pneumococcal vaccine status: Up to date per patient. Still awaiting noted from previous PCP.  Covid-19 vaccine status: Information provided on how to obtain vaccines.  Booster due  Qualifies for Shingles Vaccine? Yes   Zostavax completed No   Shingrix Completed?: No.    Education has been provided regarding the importance of this vaccine. Patient has been advised to call insurance company to determine out of pocket expense if they have not yet received this vaccine. Advised may also receive vaccine at local pharmacy or Health Dept. Verbalized acceptance and understanding.  Screening Tests Health Maintenance  Topic Date Due  . Hepatitis C Screening  Never done  . TETANUS/TDAP  Never done  . COLONOSCOPY (Pts 45-60yrs Insurance coverage will need to be confirmed)  Never done  . DEXA SCAN  Never done  . PNA vac Low Risk Adult (1 of 2 - PCV13) Never done  . COVID-19 Vaccine (3 - Booster for Pfizer series) 12/27/2019  . INFLUENZA VACCINE  Completed    Health Maintenance  Health Maintenance Due  Topic Date Due  . Hepatitis C Screening  Never done  . TETANUS/TDAP  Never done  . COLONOSCOPY (Pts 45-66yrs Insurance coverage will need to be  confirmed)  Never done  . DEXA SCAN  Never done  .  PNA vac Low Risk Adult (1 of 2 - PCV13) Never done  . COVID-19 Vaccine (3 - Booster for Pfizer series) 12/27/2019    Colorectal cancer screening: Due-Declined  Mammogram status: Ordered today. Pt provided with contact info and advised to call to schedule appt.   Bone Density status: Due-Declined  Lung Cancer Screening: (Low Dose CT Chest recommended if Age 3-80 years, 30 pack-year currently smoking OR have quit w/in 15years.) does not qualify.     Additional Screening:  Hepatitis C Screening: does qualify; phone  Visit-Discuss with PCP  Vision Screening: Recommended annual ophthalmology exams for early detection of glaucoma and other disorders of the eye. Is the patient up to date with their annual eye exam?  No  Who is the provider or what is the name of the office in which the patient attends annual eye exams? unsure If pt is not established with a provider, would they like to be referred to a provider to establish care? No .   Dental Screening: Recommended annual dental exams for proper oral hygiene  Community Resource Referral / Chronic Care Management: CRR required this visit?  No   CCM required this visit?  No      Plan:     I have personally reviewed and noted the following in the patient's chart:   . Medical and social history . Use of alcohol, tobacco or illicit drugs  . Current medications and supplements . Functional ability and status . Nutritional status . Physical activity . Advanced directives . List of other physicians . Hospitalizations, surgeries, and ER visits in previous 12 months . Vitals . Screenings to include cognitive, depression, and falls . Referrals and appointments  In addition, I have reviewed and discussed with patient certain preventive protocols, quality metrics, and best practice recommendations. A written personalized care plan for preventive services as well as general  preventive health recommendations were provided to patient.   Due to this being a telephonic visit, the after visit summary with patients personalized plan was offered to patient via mail or my-chart.  Patient would like to access on my-chart.   Roanna Raider, LPN   2/33/6122  Nurse Health Advisor  Nurse Notes: None

## 2020-03-23 ENCOUNTER — Ambulatory Visit (INDEPENDENT_AMBULATORY_CARE_PROVIDER_SITE_OTHER): Payer: Medicare Other

## 2020-03-23 VITALS — Ht 63.0 in | Wt 165.0 lb

## 2020-03-23 DIAGNOSIS — Z1231 Encounter for screening mammogram for malignant neoplasm of breast: Secondary | ICD-10-CM

## 2020-03-23 DIAGNOSIS — Z0000 Encounter for general adult medical examination without abnormal findings: Secondary | ICD-10-CM

## 2020-03-23 NOTE — Patient Instructions (Signed)
Cheryl Yu , Thank you for taking time to complete your Medicare Wellness Visit. I appreciate your ongoing commitment to your health goals. Please review the following plan we discussed and let me know if I can assist you in the future.   Screening recommendations/referrals: Colonoscopy: Due-Declined today. Please call the office to schedule if you change your mind. Mammogram: Ordered today. Someone will be calling you to schedule. Bone Density: Due-Declined today. Please call the office to schedule if you change your mind. Recommended yearly ophthalmology/optometry visit for glaucoma screening and checkup Recommended yearly dental visit for hygiene and checkup  Vaccinations: Influenza vaccine: Up to date Pneumococcal vaccine: Unsure of status. Awaiting notes from previous PCP. Tdap vaccine: Unsure of status. Awaiting notes from previous PCP. Shingles vaccine: Discuss with pharmacy   Covid-19:Due for booster.  Advanced directives: Information mailed today.  Conditions/risks identified: See problem list  Next appointment: Follow up in one year for your annual wellness visit 03/29/2021 @ 8:15   Preventive Care 65 Years and Older, Female Preventive care refers to lifestyle choices and visits with your health care provider that can promote health and wellness. What does preventive care include?  A yearly physical exam. This is also called an annual well check.  Dental exams once or twice a year.  Routine eye exams. Ask your health care provider how often you should have your eyes checked.  Personal lifestyle choices, including:  Daily care of your teeth and gums.  Regular physical activity.  Eating a healthy diet.  Avoiding tobacco and drug use.  Limiting alcohol use.  Practicing safe sex.  Taking low-dose aspirin every day.  Taking vitamin and mineral supplements as recommended by your health care provider. What happens during an annual well check? The services and  screenings done by your health care provider during your annual well check will depend on your age, overall health, lifestyle risk factors, and family history of disease. Counseling  Your health care provider may ask you questions about your:  Alcohol use.  Tobacco use.  Drug use.  Emotional well-being.  Home and relationship well-being.  Sexual activity.  Eating habits.  History of falls.  Memory and ability to understand (cognition).  Work and work Astronomer.  Reproductive health. Screening  You may have the following tests or measurements:  Height, weight, and BMI.  Blood pressure.  Lipid and cholesterol levels. These may be checked every 5 years, or more frequently if you are over 25 years old.  Skin check.  Lung cancer screening. You may have this screening every year starting at age 28 if you have a 30-pack-year history of smoking and currently smoke or have quit within the past 15 years.  Fecal occult blood test (FOBT) of the stool. You may have this test every year starting at age 62.  Flexible sigmoidoscopy or colonoscopy. You may have a sigmoidoscopy every 5 years or a colonoscopy every 10 years starting at age 78.  Hepatitis C blood test.  Hepatitis B blood test.  Sexually transmitted disease (STD) testing.  Diabetes screening. This is done by checking your blood sugar (glucose) after you have not eaten for a while (fasting). You may have this done every 1-3 years.  Bone density scan. This is done to screen for osteoporosis. You may have this done starting at age 26.  Mammogram. This may be done every 1-2 years. Talk to your health care provider about how often you should have regular mammograms. Talk with your health care provider about  your test results, treatment options, and if necessary, the need for more tests. Vaccines  Your health care provider may recommend certain vaccines, such as:  Influenza vaccine. This is recommended every  year.  Tetanus, diphtheria, and acellular pertussis (Tdap, Td) vaccine. You may need a Td booster every 10 years.  Zoster vaccine. You may need this after age 49.  Pneumococcal 13-valent conjugate (PCV13) vaccine. One dose is recommended after age 76.  Pneumococcal polysaccharide (PPSV23) vaccine. One dose is recommended after age 23. Talk to your health care provider about which screenings and vaccines you need and how often you need them. This information is not intended to replace advice given to you by your health care provider. Make sure you discuss any questions you have with your health care provider. Document Released: 02/12/2015 Document Revised: 10/06/2015 Document Reviewed: 11/17/2014 Elsevier Interactive Patient Education  2017 [place] Prevention in the Home Falls can cause injuries. They can happen to people of all ages. There are many things you can do to make your home safe and to help prevent falls. What can I do on the outside of my home?  Regularly fix the edges of walkways and driveways and fix any cracks.  Remove anything that might make you trip as you walk through a door, such as a raised step or threshold.  Trim any bushes or trees on the path to your home.  Use bright outdoor lighting.  Clear any walking paths of anything that might make someone trip, such as rocks or tools.  Regularly check to see if handrails are loose or broken. Make sure that both sides of any steps have handrails.  Any raised decks and porches should have guardrails on the edges.  Have any leaves, snow, or ice cleared regularly.  Use sand or salt on walking paths during winter.  Clean up any spills in your garage right away. This includes oil or grease spills. What can I do in the bathroom?  Use night lights.  Install grab bars by the toilet and in the tub and shower. Do not use towel bars as grab bars.  Use non-skid mats or decals in the tub or shower.  If you  need to sit down in the shower, use a plastic, non-slip stool.  Keep the floor dry. Clean up any water that spills on the floor as soon as it happens.  Remove soap buildup in the tub or shower regularly.  Attach bath mats securely with double-sided non-slip rug tape.  Do not have throw rugs and other things on the floor that can make you trip. What can I do in the bedroom?  Use night lights.  Make sure that you have a light by your bed that is easy to reach.  Do not use any sheets or blankets that are too big for your bed. They should not hang down onto the floor.  Have a firm chair that has side arms. You can use this for support while you get dressed.  Do not have throw rugs and other things on the floor that can make you trip. What can I do in the kitchen?  Clean up any spills right away.  Avoid walking on wet floors.  Keep items that you use a lot in easy-to-reach places.  If you need to reach something above you, use a strong step stool that has a grab bar.  Keep electrical cords out of the way.  Do not use floor polish or wax  that makes floors slippery. If you must use wax, use non-skid floor wax.  Do not have throw rugs and other things on the floor that can make you trip. What can I do with my stairs?  Do not leave any items on the stairs.  Make sure that there are handrails on both sides of the stairs and use them. Fix handrails that are broken or loose. Make sure that handrails are as long as the stairways.  Check any carpeting to make sure that it is firmly attached to the stairs. Fix any carpet that is loose or worn.  Avoid having throw rugs at the top or bottom of the stairs. If you do have throw rugs, attach them to the floor with carpet tape.  Make sure that you have a light switch at the top of the stairs and the bottom of the stairs. If you do not have them, ask someone to add them for you. What else can I do to help prevent falls?  Wear shoes  that:  Do not have high heels.  Have rubber bottoms.  Are comfortable and fit you well.  Are closed at the toe. Do not wear sandals.  If you use a stepladder:  Make sure that it is fully opened. Do not climb a closed stepladder.  Make sure that both sides of the stepladder are locked into place.  Ask someone to hold it for you, if possible.  Clearly mark and make sure that you can see:  Any grab bars or handrails.  First and last steps.  Where the edge of each step is.  Use tools that help you move around (mobility aids) if they are needed. These include:  Canes.  Walkers.  Scooters.  Crutches.  Turn on the lights when you go into a dark area. Replace any light bulbs as soon as they burn out.  Set up your furniture so you have a clear path. Avoid moving your furniture around.  If any of your floors are uneven, fix them.  If there are any pets around you, be aware of where they are.  Review your medicines with your doctor. Some medicines can make you feel dizzy. This can increase your chance of falling. Ask your doctor what other things that you can do to help prevent falls. This information is not intended to replace advice given to you by your health care provider. Make sure you discuss any questions you have with your health care provider. Document Released: 11/12/2008 Document Revised: 06/24/2015 Document Reviewed: 02/20/2014 Elsevier Interactive Patient Education  2017 [employer].

## 2020-12-07 ENCOUNTER — Encounter: Payer: Self-pay | Admitting: Family Medicine

## 2020-12-07 ENCOUNTER — Other Ambulatory Visit: Payer: Self-pay

## 2020-12-07 ENCOUNTER — Ambulatory Visit (INDEPENDENT_AMBULATORY_CARE_PROVIDER_SITE_OTHER): Payer: Medicare Other | Admitting: Family Medicine

## 2020-12-07 VITALS — BP 124/80 | HR 78 | Temp 97.6°F | Ht 63.0 in | Wt 170.4 lb

## 2020-12-07 DIAGNOSIS — K219 Gastro-esophageal reflux disease without esophagitis: Secondary | ICD-10-CM | POA: Diagnosis not present

## 2020-12-07 DIAGNOSIS — M810 Age-related osteoporosis without current pathological fracture: Secondary | ICD-10-CM | POA: Insufficient documentation

## 2020-12-07 DIAGNOSIS — F411 Generalized anxiety disorder: Secondary | ICD-10-CM | POA: Diagnosis not present

## 2020-12-07 DIAGNOSIS — Z1231 Encounter for screening mammogram for malignant neoplasm of breast: Secondary | ICD-10-CM | POA: Diagnosis not present

## 2020-12-07 DIAGNOSIS — Z23 Encounter for immunization: Secondary | ICD-10-CM

## 2020-12-07 MED ORDER — ALENDRONATE SODIUM 70 MG PO TABS: 70.0000 mg | ORAL_TABLET | ORAL | 2 refills | Status: DC

## 2020-12-07 MED ORDER — FAMOTIDINE 20 MG PO TABS: 20.0000 mg | ORAL_TABLET | Freq: Two times a day (BID) | ORAL | 2 refills | Status: DC

## 2020-12-07 MED ORDER — BUPROPION HCL ER (SR) 100 MG PO TB12: 100.0000 mg | ORAL_TABLET | Freq: Two times a day (BID) | ORAL | 2 refills | Status: DC

## 2020-12-07 MED ORDER — CITALOPRAM HYDROBROMIDE 40 MG PO TABS: 40.0000 mg | ORAL_TABLET | Freq: Every day | ORAL | 2 refills | Status: DC

## 2020-12-07 NOTE — Progress Notes (Signed)
[facility] [address] [place] [id number] Cheryl Yu  Office Visit  Subjective:    Patient ID: Cheryl Yu, female    DOB: January 28, 1946, 75 y.o..   MRN: [id number]  Chief Complaint  Patient presents with   Follow-up    F/u meds.   Needs refills.  Wants flu shot today.     History of Present Illness:  Patient is in today for follow-up of her chronic medical conditions.  Cheryl Yu has a history of osteoporosis. She is managed on Fosamax. She takes a Vitamin D supplement. She is supposed to take a calcium supplement as well, but she notes she can't always afford this.  Cheryl Yu has a history of a mood disorder. Although her medical record indicated this was depression, she notes that she actually has more issues with anxiety feelings. She ahs been on Celexa and bupropion for some years now.  Cheryl Yu has a history of GERD. She has been well managed with Pepcid.  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     cholecalciferol (VITAMIN D3) 25 MCG (1000 UNIT) tablet Take 1,000 Units by mouth daily.     ibuprofen (ADVIL) 600 MG tablet Take 1 tablet (600 mg total) by mouth 2 (two) times daily as needed. 60 tablet 0   alendronate (FOSAMAX) 70 MG tablet Take 1 tablet (70 mg total) by mouth once a week. Take with a full glass of water on an empty stomach. 12 tablet 2   buPROPion (WELLBUTRIN SR) 100 MG 12 hr tablet Take 1 tablet (100 mg total) by mouth 2 (two) times daily. 180 tablet 2   citalopram (CELEXA) 40 MG tablet Take 1 tablet (40 mg total) by mouth daily. 90 tablet 2   famotidine (PEPCID) 20 MG tablet Take 1 tablet (20 mg total) by mouth 2 (two) times daily. 180 tablet 2   folic acid (FOLVITE) 1 MG tablet TAKE 1 TABLET BY MOUTH EVERY DAY (Patient not taking: Reported on 12/07/2020) 90 tablet 3   No facility-administered medications prior to visit.   Allergies  Allergen Reactions   Codeine Other (See Comments)   Morphine Other (See Comments)     Objective:   Today's Vitals   12/07/20 1136  BP: 124/80  Pulse: 78  Temp: 97.6 F (36.4 C)  TempSrc: Temporal  SpO2: 98%  Weight: 170 lb 6.4 oz (77.3 kg)  Height: 5\' 3"  (1.6 m)   Body mass index is 30.19 kg/m.   General: Well developed, well nourished. No acute distress. Psych: Alert and oriented. Normal mood and affect.  Health Maintenance Due  Topic Date Due   Hepatitis C Screening  Never done   TETANUS/TDAP  Never done   COLONOSCOPY (Pts 45-58yrs Insurance coverage will need to be confirmed)  Never done   Zoster Vaccines- Shingrix (1 of 2) Never done   Pneumonia Vaccine 43+ Years old (1 - PCV) Never done   DEXA SCAN  Never done   COVID-19 Vaccine (3 - Booster for Pfizer series) 08/21/2019   INFLUENZA VACCINE  08/30/2020   Depression screen [facility] 2/9 12/07/2020 03/23/2020 03/19/2019  Decreased Interest 3 0 0   Down, Depressed, Hopeless 1 0 0  PHQ - 2 Score 4 0 0  Altered sleeping 3 - -  Tired, decreased energy 3 - -  Change in appetite 1 - -  Feeling bad or failure about yourself  1 - -  Trouble concentrating 3 - -  Moving slowly or fidgety/restless 3 - -  Suicidal thoughts 1 - -  PHQ-9 Score 19 - -  Difficult doing work/chores Somewhat difficult - -   GAD 7 : Generalized Anxiety Score 12/07/2020  Nervous, Anxious, on Edge 3  Control/stop worrying 3  Worry too much - different things 3  Trouble relaxing 2  Restless 2  Easily annoyed or irritable 3  Afraid - awful might happen 3  Total GAD 7 Score 19  Anxiety Difficulty Somewhat difficult     Assessment & Plan:   1. 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's last DXA was about 6 years ago. I will refer her for a repeat exam to monitor response due to her alendronate. I did advise her to take a calcium supplement. We discussed her potentially using Tums for this, as it will also benefit her acid reflux.  - alendronate (FOSAMAX) 70 MG tablet; Take 1 tablet (70 mg total) by mouth once a week. Take with a full glass of water on an empty stomach.  Dispense: 12 tablet; Refill: 2 - DG Bone Density; Future  2. Generalized anxiety disorder We discussed that the medications used to treat depression are also used for anxiety conditions. I will plan to continue her current therapy and reassess this at her To[facility] appointment in Feb.  - buPROPion ER [facility] SR) 100 MG 12 hr tablet; Take 1 tablet (100 mg total) by mouth 2 (two) times daily.  Dispense: 180 tablet; Refill: 2 - citalopram (CELEXA) 40 MG tablet; Take 1 tablet (40 mg total) by mouth daily.  Dispense: 90 tablet; Refill: 2  3. Gastroesophageal reflux disease without esophagitis I will continue her on Pepcid.  - famotidine (PEPCID) 20 MG tablet; Take 1 tablet (20 mg total) by mouth 2 (two) times daily.  Dispense: 180 tablet; Refill:  2  4. Encounter for screening mammogram for malignant neoplasm of breast  - MM DIGITAL SCREENING BILATERAL; Future  5. Need for influenza vaccination  - Flu Vaccine QUAD High Dose(Fluad)    [facility], MD

## 2021-01-20 ENCOUNTER — Ambulatory Visit: Payer: Medicare Other

## 2021-01-20 ENCOUNTER — Other Ambulatory Visit: Payer: Medicare Other

## 2021-03-02 ENCOUNTER — Encounter: Payer: Medicare Other | Admitting: Family Medicine

## 2021-03-07 ENCOUNTER — Ambulatory Visit: Payer: Medicare Other

## 2021-03-29 ENCOUNTER — Ambulatory Visit: Payer: Medicare Other

## 2021-04-19 ENCOUNTER — Ambulatory Visit (INDEPENDENT_AMBULATORY_CARE_PROVIDER_SITE_OTHER): Payer: Medicare Other

## 2021-04-19 DIAGNOSIS — Z0000 Encounter for general adult medical examination without abnormal findings: Secondary | ICD-10-CM

## 2021-04-19 DIAGNOSIS — Z0100 Encounter for examination of eyes and vision without abnormal findings: Secondary | ICD-10-CM | POA: Diagnosis not present

## 2021-04-19 NOTE — Progress Notes (Signed)
? ?Subjective:  ? Cheryl Yu is a 76 y.o. female who presents for Medicare Annual (Subsequent) preventive examination. ? ? ?I connected with Hollace Kinnier today by telephone and verified that I am speaking with the correct person using two identifiers. ?Location patient: home ?Location provider: work ?Persons participating in the virtual visit: patient, provider. ?  ?I discussed the limitations, risks, security and privacy concerns of performing an evaluation and management service by telephone and the availability of in person appointments. I also discussed with the patient that there may be a patient responsible charge related to this service. The patient expressed understanding and verbally consented to this telephonic visit.  ?  ?Interactive audio and video telecommunications were attempted between this provider and patient, however failed, due to patient having technical difficulties OR patient did not have access to video capability.  We continued and completed visit with audio only. ? ?  ?Review of Systems    ? ?Cardiac Risk Factors include: advanced age (>25men, >51 women) ? ?   ?Objective:  ?  ?Today's Vitals  ? ?There is no height or weight on file to calculate BMI. ? ?Advanced Directives 04/19/2021 03/23/2020 03/19/2019  ?Does Patient Have a Medical Advance Directive? No Yes No  ?Does patient want to make changes to medical advance directive? - Yes (MAU/Ambulatory/Procedural Areas - Information given) -  ?Would patient like information on creating a medical advance directive? No - Patient declined - No - Patient declined  ? ? ?Current Medications (verified) ?Outpatient Encounter Medications as of 04/19/2021  ?Medication Sig  ? alendronate (FOSAMAX) 70 MG tablet Take 1 tablet (70 mg total) by mouth once a week. Take with a full glass of water on an empty stomach.  ? ASPIRIN 81 PO Take by mouth.  ? buPROPion ER (WELLBUTRIN SR) 100 MG 12 hr tablet Take 1 tablet (100 mg total) by mouth 2 (two) times daily.  ?  cholecalciferol (VITAMIN D3) 25 MCG (1000 UNIT) tablet Take 1,000 Units by mouth daily.  ? citalopram (CELEXA) 40 MG tablet Take 1 tablet (40 mg total) by mouth daily.  ? famotidine (PEPCID) 20 MG tablet Take 1 tablet (20 mg total) by mouth 2 (two) times daily.  ? ibuprofen (ADVIL) 600 MG tablet Take 1 tablet (600 mg total) by mouth 2 (two) times daily as needed.  ? ?No facility-administered encounter medications on file as of 04/19/2021.  ? ? ?Allergies (verified) ?Codeine and Morphine  ? ?History: ?Past Medical History:  ?Diagnosis Date  ? Allergy   ? Arthritis   ? Depression   ? GERD (gastroesophageal reflux disease)   ? Syncope   ? ?Past Surgical History:  ?Procedure Laterality Date  ? ABDOMINAL HYSTERECTOMY    ? Took out Uterus  ? BREAST ENHANCEMENT SURGERY  1990  ? CHOLECYSTECTOMY  1977  ? complete bladder reconstruction    ? holes in eyes    ? 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 ?Family History  ?Problem Relation Age of Onset  ? Arthritis Mother   ? Cancer Mother   ? Hearing loss Mother   ? Stroke Mother   ? Cancer Father   ? Cancer Sister   ? Early death Sister   ? Kidney disease Sister   ? Depression Daughter   ? Alcohol abuse Son   ? Depression Son   ? Diabetes Son   ? Arthritis Maternal Grandmother   ? Heart disease Maternal Grandmother   ? Cancer Maternal Grandfather   ?  Early death Maternal Grandfather   ? ?Social History  ? ?Socioeconomic History  ? Marital status: Divorced  ?  Spouse name: Not on file  ? Number of children: Not on file  ? Years of education: Not on file  ? Highest education level: Not on file  ?Occupational History  ? Not on file  ?Tobacco Use  ? Smoking status: Never  ? Smokeless tobacco: Never  ?Substance and Sexual Activity  ? Alcohol use: Yes  ?  Comment: 1 glass wine daily  ? Drug use: Never  ? Sexual activity: Yes  ?Other Topics Concern  ? Not on file  ?Social History Narrative  ? Not on file  ? ?Social Determinants of Health  ? ?Financial Resource Strain: Low Risk   ?  Difficulty of Paying Living Expenses: Not hard at all  ?Food Insecurity: No Food Insecurity  ? Worried About Charity fundraiser in the Last Year: Never true  ? Ran Out of Food in the Last Year: Never true  ?Transportation Needs: No Transportation Needs  ? Lack of Transportation (Medical): No  ? Lack of Transportation (Non-Medical): No  ?Physical Activity: Inactive  ? Days of Exercise per Week: 0 days  ? Minutes of Exercise per Session: 0 min  ?Stress: No Stress Concern Present  ? Feeling of Stress : Not at all  ?Social Connections: Socially Isolated  ? Frequency of Communication with Friends and Family: Three times a week  ? Frequency of Social Gatherings with Friends and Family: Three times a week  ? Attends Religious Services: Never  ? Active Member of Clubs or Organizations: No  ? Attends Archivist Meetings: Never  ? Marital Status: Divorced  ? ? ?Tobacco Counseling ?Counseling given: Not Answered ? ? ?Clinical Intake: ? ?Pre-visit preparation completed: Yes ? ?Pain : No/denies pain ? ?  ? ?Nutritional Risks: None ?Diabetes: No ? ?How often do you need to have someone help you when you read instructions, pamphlets, or other written materials from your doctor or pharmacy?: 1 - Never ?What is the last grade level you completed in school?: High School ? ?Diabetic?no  ? ?Interpreter Needed?: No ? ?Information entered by :: L.Erine Phenix,LPn ? ? ?Activities of Daily Living ?In your present state of health, do you have any difficulty performing the following activities: 04/19/2021  ?Hearing? N  ?Vision? N  ?Difficulty concentrating or making decisions? N  ?Walking or climbing stairs? N  ?Dressing or bathing? N  ?Doing errands, shopping? N  ?Preparing Food and eating ? N  ?Using the Toilet? N  ?In the past six months, have you accidently leaked urine? N  ?Do you have problems with loss of bowel control? N  ?Managing your Medications? N  ?Managing your Finances? N  ?Housekeeping or managing your Housekeeping? N   ?Some recent data might be hidden  ? ? ?Patient Care Team: ?Haydee Salter, MD as PCP - General (Family Medicine) ? ?Indicate any recent Medical Services you may have received from other than [facility] providers in the past year (date may be approximate). ? ?   ?Assessment:  ? This is a routine wellness examination for [place]. ? ?Hearing/Vision screen ?Vision Screening - Comments:: Referral eye doctor 04/19/2021 ? ?Dietary issues and exercise activities discussed: ?Current Exercise Habits: The patient does not participate in regular exercise at present, Exercise limited by: None identified ? ? Goals Addressed   ? ?  ?  ?  ?  ? This Visit's Progress  ?  Increase physical activity   On track  ? ?  ? ?Depression Screen ?PHQ 2/9 Scores 04/19/2021 04/19/2021 12/07/2020 03/23/2020 03/19/2019  ?PHQ - 2 Score 0 0 4 0 0  ?PHQ- 9 Score - - 19 - -  ?  ?Fall Risk ?Fall Risk  04/19/2021 03/23/2020 03/19/2019  ?Falls in the past year? 0 0 0  ?Number falls in past yr: 0 0 0  ?Injury with Fall? 0 0 0  ?Risk for fall due to : No Fall Risks - -  ?Follow up Falls evaluation completed Falls prevention discussed Education provided;Falls prevention discussed  ? ? ?FALL RISK PREVENTION PERTAINING TO THE HOME: ? ?Any stairs in or around the home? Yes  ?If so, are there any without handrails? No  ?Home free of loose throw rugs in walkways, pet beds, electrical cords, etc? Yes  ?Adequate lighting in your home to reduce risk of falls? Yes  ? ?ASSISTIVE DEVICES UTILIZED TO PREVENT FALLS: ? ?Life alert? No  ?Use of a cane, walker or w/c? No  ?Grab bars in the bathroom? No  ?Shower chair or bench in shower? No  ?Elevated toilet seat or a handicapped toilet? No  ? ? ?Cognitive Function: ? Normal cognitive status assessed by direct observation by this Nurse Health Advisor. No abnormalities found.  ? ?  ?  ? ?Immunizations ?Immunization History  ?Administered Date(s) Administered  ? Fluad Quad(high Dose 65+) 10/25/2018, 11/20/2019, 12/07/2020  ? PFIZER(Purple  Top)SARS-COV-2 Vaccination 06/07/2019, 06/26/2019  ? ? ?TDAP status: Due, Education has been provided regarding the importance of this vaccine. Advised may receive this vaccine at local pharmacy or Health Dept. [facility]

## 2021-04-19 NOTE — Patient Instructions (Signed)
[name] , ?Thank you for taking time to come for your Medicare Wellness Visit. I appreciate your ongoing commitment to your health goals. Please review the following plan we discussed and let me know if I can assist you in the future.  ? ?Screening recommendations/referrals: ?Colonoscopy: no longer required  ?Mammogram: no longer required  ?Bone Density: scheduled 06/22/2021 ?Recommended yearly ophthalmology/optometry visit for glaucoma screening and checkup ?Recommended yearly dental visit for hygiene and checkup ? ?Vaccinations: ?Influenza vaccine: completed  ?Pneumococcal vaccine: will discuss with PCP  ?Tdap vaccine: due  ?Shingles vaccine: will consider    ? ?Advanced directives: none  ? ?Conditions/risks identified: none  ? ?Next appointment: none  ? ? ?Preventive Care 26 Years and Older, Female ?Preventive care refers to lifestyle choices and visits with your health care provider that can promote health and wellness. ?What does preventive care include? ?A yearly physical exam. This is also called an annual well check. ?Dental exams once or twice a year. ?Routine eye exams. Ask your health care provider how often you should have your eyes checked. ?Personal lifestyle choices, including: ?Daily care of your teeth and gums. ?Regular physical activity. ?Eating a healthy diet. ?Avoiding tobacco and drug use. ?Limiting alcohol use. ?Practicing safe sex. ?Taking low-dose aspirin every day. ?Taking vitamin and mineral supplements as recommended by your health care provider. ?What happens during an annual well check? ?The services and screenings done by your health care provider during your annual well check will depend on your age, overall health, lifestyle risk factors, and family history of disease. ?Counseling  ?Your health care provider may ask you questions about your: ?Alcohol use. ?Tobacco use. ?Drug use. ?Emotional well-being. ?Home and relationship well-being. ?Sexual activity. ?Eating habits. ?History  of falls. ?Memory and ability to understand (cognition). ?Work and work Astronomer. ?Reproductive health. ?Screening  ?You may have the following tests or measurements: ?Height, weight, and BMI. ?Blood pressure. ?Lipid and cholesterol levels. These may be checked every 5 years, or more frequently if you are over 76 years old. ?Skin check. ?Lung cancer screening. You may have this screening every year starting at age 38 if you have a 30-pack-year history of smoking and currently smoke or have quit within the past 15 years. ?Fecal occult blood test (FOBT) of the stool. You may have this test every year starting at age 39. ?Flexible sigmoidoscopy or colonoscopy. You may have a sigmoidoscopy every 5 years or a colonoscopy every 10 years starting at age 76. ?Hepatitis C blood test. ?Hepatitis B blood test. ?Sexually transmitted disease (STD) testing. ?Diabetes screening. This is done by checking your blood sugar (glucose) after you have not eaten for a while (fasting). You may have this done every 1-3 years. ?Bone density scan. This is done to screen for osteoporosis. You may have this done starting at age 73. ?Mammogram. This may be done every 1-2 years. Talk to your health care provider about how often you should have regular mammograms. ?Talk with your health care provider about your test results, treatment options, and if necessary, the need for more tests. ?Vaccines  ?Your health care provider may recommend certain vaccines, such as: ?Influenza vaccine. This is recommended every year. ?Tetanus, diphtheria, and acellular pertussis (Tdap, Td) vaccine. You may need a Td booster every 10 years. ?Zoster vaccine. You may need this after age 3. ?Pneumococcal 13-valent conjugate (PCV13) vaccine. One dose is recommended after age 50. ?Pneumococcal polysaccharide (PPSV23) vaccine. One dose is recommended after age 72. ?Talk to your health care  provider about which screenings and vaccines you need and how often you need  them. ?This information is not intended to replace advice given to you by your health care provider. Make sure you discuss any questions you have with your health care provider. ?Document Released: 02/12/2015 Document Revised: 10/06/2015 Document Reviewed: 11/17/2014 ?Elsevier Interactive Patient Education ? 2017 [place]. ? ?Fall Prevention in the Home ?Falls can cause injuries. They can happen to people of all ages. There are many things you can do to make your home safe and to help prevent falls. ?What can I do on the outside of my home? ?Regularly fix the edges of walkways and driveways and fix any cracks. ?Remove anything that might make you trip as you walk through a door, such as a raised step or threshold. ?Trim any bushes or trees on the path to your home. ?Use bright outdoor lighting. ?Clear any walking paths of anything that might make someone trip, such as rocks or tools. ?Regularly check to see if handrails are loose or broken. Make sure that both sides of any steps have handrails. ?Any raised decks and porches should have guardrails on the edges. ?Have any leaves, snow, or ice cleared regularly. ?Use sand or salt on walking paths during winter. ?Clean up any spills in your garage right away. This includes oil or grease spills. ?What can I do in the bathroom? ?Use night lights. ?Install grab bars by the toilet and in the tub and shower. Do not use towel bars as grab bars. ?Use non-skid mats or decals in the tub or shower. ?If you need to sit down in the shower, use a plastic, non-slip stool. ?Keep the floor dry. Clean up any water that spills on the floor as soon as it happens. ?Remove soap buildup in the tub or shower regularly. ?Attach bath mats securely with double-sided non-slip rug tape. ?Do not have throw rugs and other things on the floor that can make you trip. ?What can I do in the bedroom? ?Use night lights. ?Make sure that you have a light by your bed that is easy to reach. ?Do not use  any sheets or blankets that are too big for your bed. They should not hang down onto the floor. ?Have a firm chair that has side arms. You can use this for support while you get dressed. ?Do not have throw rugs and other things on the floor that can make you trip. ?What can I do in the kitchen? ?Clean up any spills right away. ?Avoid walking on wet floors. ?Keep items that you use a lot in easy-to-reach places. ?If you need to reach something above you, use a strong step stool that has a grab bar. ?Keep electrical cords out of the way. ?Do not use floor polish or wax that makes floors slippery. If you must use wax, use non-skid floor wax. ?Do not have throw rugs and other things on the floor that can make you trip. ?What can I do with my stairs? ?Do not leave any items on the stairs. ?Make sure that there are handrails on both sides of the stairs and use them. Fix handrails that are broken or loose. Make sure that handrails are as long as the stairways. ?Check any carpeting to make sure that it is firmly attached to the stairs. Fix any carpet that is loose or worn. ?Avoid having throw rugs at the top or bottom of the stairs. If you do have throw rugs, attach them to the  floor with carpet tape. ?Make sure that you have a light switch at the top of the stairs and the bottom of the stairs. If you do not have them, ask someone to add them for you. ?What else can I do to help prevent falls? ?Wear shoes that: ?Do not have high heels. ?Have rubber bottoms. ?Are comfortable and fit you well. ?Are closed at the toe. Do not wear sandals. ?If you use a stepladder: ?Make sure that it is fully opened. Do not climb a closed stepladder. ?Make sure that both sides of the stepladder are locked into place. ?Ask someone to hold it for you, if possible. ?Clearly mark and make sure that you can see: ?Any grab bars or handrails. ?First and last steps. ?Where the edge of each step is. ?Use tools that help you move around (mobility aids)  if they are needed. These include: ?Canes. ?Walkers. ?Scooters. ?Crutches. ?Turn on the lights when you go into a dark area. Replace any light bulbs as soon as they burn out. ?Set up your furniture so you hav

## 2021-06-22 ENCOUNTER — Ambulatory Visit
Admission: RE | Admit: 2021-06-22 | Discharge: 2021-06-22 | Disposition: A | Discharge status: Home / Self Care | Payer: Medicare Other | Source: Ambulatory Visit | Attending: Family Medicine | Admitting: Family Medicine

## 2021-06-22 DIAGNOSIS — M810 Age-related osteoporosis without current pathological fracture: Secondary | ICD-10-CM

## 2021-07-11 ENCOUNTER — Ambulatory Visit: Payer: Medicare Other

## 2021-08-01 ENCOUNTER — Ambulatory Visit
Admission: RE | Admit: 2021-08-01 | Discharge: 2021-08-01 | Disposition: A | Discharge status: Home / Self Care | Payer: Medicare Other | Source: Ambulatory Visit | Attending: Family Medicine | Admitting: Family Medicine

## 2021-08-01 DIAGNOSIS — Z1231 Encounter for screening mammogram for malignant neoplasm of breast: Secondary | ICD-10-CM

## 2021-08-29 ENCOUNTER — Ambulatory Visit: Payer: Medicare Other | Admitting: Family Medicine

## 2021-08-29 ENCOUNTER — Ambulatory Visit (INDEPENDENT_AMBULATORY_CARE_PROVIDER_SITE_OTHER): Payer: Medicare Other | Admitting: Family Medicine

## 2021-08-29 ENCOUNTER — Ambulatory Visit (INDEPENDENT_AMBULATORY_CARE_PROVIDER_SITE_OTHER): Payer: Medicare Other

## 2021-08-29 ENCOUNTER — Other Ambulatory Visit: Payer: Self-pay | Admitting: Family Medicine

## 2021-08-29 ENCOUNTER — Encounter: Payer: Self-pay | Admitting: Family Medicine

## 2021-08-29 VITALS — BP 122/82 | HR 80 | Temp 97.6°F | Ht 63.0 in | Wt 179.8 lb

## 2021-08-29 DIAGNOSIS — M7552 Bursitis of left shoulder: Secondary | ICD-10-CM

## 2021-08-29 DIAGNOSIS — M7061 Trochanteric bursitis, right hip: Secondary | ICD-10-CM | POA: Diagnosis not present

## 2021-08-29 MED ORDER — MELOXICAM 7.5 MG PO TABS: 7.5000 mg | ORAL_TABLET | Freq: Every day | ORAL | 0 refills | Status: DC

## 2021-08-29 MED ORDER — CYCLOBENZAPRINE HCL 10 MG PO TABS: 10.0000 mg | ORAL_TABLET | Freq: Every day | ORAL | 0 refills | Status: DC

## 2021-08-29 NOTE — Telephone Encounter (Signed)
Caller Name: Yareni Creps Call back phone #: [phone]  Reason for Call: Pt was here for an appt today 08/29/2021. She had a prescription sent out for her but it was sent to the wrong pharmacy. If this order could please be sent again but to [facility]  [address] [place], [place], [place] [address]

## 2021-08-29 NOTE — Progress Notes (Signed)
Established Patient Office Visit  Subjective   Patient ID: Cheryl Yu, female    DOB: September 12, 1945  Age: 76 y.o. MRN: [id number]  Chief Complaint  Patient presents with   Acute Visit    R hip pain, trouble lifting right leg, L shoulder pain. Anxiety medications not working as well.     HPI evaluation of a 2-week history of right lateral proximal hip pain and left shoulder pain with limited range of motion of the shoulder.  Right-hand-dominant.  There has been no injury.  Denies increased over shoulder lifting or work.  She has never had problems with her hip or shoulder before.  Denies back pain.    Review of Systems  Constitutional:  Negative for chills, diaphoresis, malaise/fatigue and weight loss.  HENT: Negative.    Eyes: Negative.  Negative for blurred vision and double vision.  Cardiovascular:  Negative for chest pain.  Gastrointestinal:  Negative for abdominal pain.  Genitourinary: Negative.   Musculoskeletal:  Positive for joint pain. Negative for back pain, falls, myalgias and neck pain.  Neurological:  Negative for speech change, loss of consciousness and weakness.  Psychiatric/Behavioral: Negative.        Objective:     BP 122/82 (BP Location: Right Arm, Patient Position: Sitting, Cuff Size: Normal)   Pulse 80   Temp 97.6 F (36.4 C) (Temporal)   Ht 5\' 3"  (1.6 m)   Wt 179 lb 12.8 oz (81.6 kg)   SpO2 96%   BMI 31.85 kg/m    Physical Exam Constitutional:      General: She is not in acute distress.    Appearance: Normal appearance. She is not ill-appearing, toxic-appearing or diaphoretic.  HENT:     Head: Normocephalic and atraumatic.     Right Ear: External ear normal.     Left Ear: External ear normal.  Eyes:     General: No scleral icterus.       Right eye: No discharge.        Left eye: No discharge.     Extraocular Movements: Extraocular movements intact.     Conjunctiva/sclera: Conjunctivae normal.  Pulmonary:     Effort: Pulmonary effort is  normal. No respiratory distress.  Abdominal:     General: Bowel sounds are normal.  Musculoskeletal:     Right shoulder: Normal.     Left shoulder: Tenderness present. Normal range of motion.       Arms:     Lumbar back: No tenderness or bony tenderness. Normal range of motion. Negative right straight leg raise test and negative left straight leg raise test.     Right hip: Tenderness present. Normal range of motion.     Left hip: No tenderness. Normal range of motion.       Legs:  Skin:    General: Skin is warm and dry.  Neurological:     Mental Status: She is alert and oriented to person, place, and time.  Psychiatric:        Mood and Affect: Mood normal.        Behavior: Behavior normal.      No results found for any visits on 08/29/21.    The ASCVD Risk score (Arnett DK, et al., 2019) failed to calculate for the following reasons:   Cannot find a previous HDL lab   Cannot find a previous total cholesterol lab    Assessment & Plan:   Problem List Items Addressed This Visit   None Visit Diagnoses  Trochanteric bursitis of right hip    -  Primary   Relevant Medications   meloxicam (MOBIC) 7.5 MG tablet   cyclobenzaprine (FLEXERIL) 10 MG tablet   Other Relevant Orders   DG Hip Unilat W OR W/O Pelvis 2-3 Views Right   Subacromial bursitis of left shoulder joint       Relevant Medications   meloxicam (MOBIC) 7.5 MG tablet   cyclobenzaprine (FLEXERIL) 10 MG tablet   Other Relevant Orders   DG Shoulder Left       Return Schedule follow-up appointment with Dr. Veto Kemps..  Start meloxicam 7.5 mg daily.  Flexeril 10 mg nightly as needed.  Shoulder exercises will be given.    Checking plain films.  Mliss Sax, MD

## 2021-08-29 NOTE — Progress Notes (Unsigned)
Initial visit for 2 week pain of the   Acute Office Visit  Subjective:     Patient ID: Cheryl Yu, female    DOB: 01/30/1946, 76 y.o.   MRN: [id number]  No chief complaint on file.   HPI Patient is in today for ***  ROS      Objective:    There were no vitals taken for this visit. {Vitals History (Optional):23777}  Physical Exam  No results found for any visits on 08/29/21.      Assessment & Plan:   Problem List Items Addressed This Visit   None   No orders of the defined types were placed in this encounter.   No follow-ups on file.  Maceo Pro, RT

## 2021-08-29 NOTE — Telephone Encounter (Signed)
Please resend Rx's to [facility].   Thanks. Dm/cma

## 2021-08-29 NOTE — Progress Notes (Unsigned)
Initial visit for 2 week LEFT shoulder pain with decreased rom and RT hip pain. Denies injury for both.

## 2021-08-30 ENCOUNTER — Other Ambulatory Visit: Payer: Self-pay | Admitting: Family Medicine

## 2021-08-30 ENCOUNTER — Telehealth: Payer: Self-pay | Admitting: Family Medicine

## 2021-08-30 DIAGNOSIS — M810 Age-related osteoporosis without current pathological fracture: Secondary | ICD-10-CM

## 2021-08-30 DIAGNOSIS — M7061 Trochanteric bursitis, right hip: Secondary | ICD-10-CM

## 2021-08-30 DIAGNOSIS — K219 Gastro-esophageal reflux disease without esophagitis: Secondary | ICD-10-CM

## 2021-08-30 DIAGNOSIS — F411 Generalized anxiety disorder: Secondary | ICD-10-CM

## 2021-08-30 DIAGNOSIS — M7552 Bursitis of left shoulder: Secondary | ICD-10-CM

## 2021-08-30 MED ORDER — MELOXICAM 7.5 MG PO TABS: 7.5000 mg | ORAL_TABLET | Freq: Every day | ORAL | 0 refills | Status: DC

## 2021-08-30 MED ORDER — CYCLOBENZAPRINE HCL 10 MG PO TABS: 10.0000 mg | ORAL_TABLET | Freq: Every day | ORAL | 0 refills | Status: DC

## 2021-08-30 NOTE — Telephone Encounter (Signed)
Rx sent to requested pharmacy and canceled at mail order

## 2021-08-30 NOTE — Telephone Encounter (Signed)
Refill request for the following meds.   Famotidine 20 mg LR 12/07/20, #180, 2 rf  Bupropion 100 mg LR  12/07/20, #180, 2 rf  Alendronate Sodium LR 12/07/20, #12, 2 rf  Citalopram 40 mg LR 12/07/20, #90, 2 rf  LOV 12/07/20 FOV  nones scheduled and was to schedule a TOC in February.   Please review and advise.  Thanks. Dm/cma

## 2021-08-30 NOTE — Telephone Encounter (Signed)
Patient scheduled for TOC  on 09/20/21.  Dm/cma

## 2021-08-30 NOTE — Telephone Encounter (Signed)
Caller Name: Roseanne Juenger Call back phone #: [phone]  Reason for Call: Pt was prescribed meloxican and cyclobenzaprine by Dr. Doreene Burke 08/29/2021. Prescription was sent to wrong pharmacy, If it could [facility] to [facility]  [address] [place], [place], [place] [address] Fax#[phone]

## 2021-09-03 ENCOUNTER — Encounter: Payer: Self-pay | Admitting: Emergency Medicine

## 2021-09-03 ENCOUNTER — Ambulatory Visit
Admission: EM | Admit: 2021-09-03 | Discharge: 2021-09-03 | Disposition: A | Discharge status: Home / Self Care | Payer: Medicare Other | Attending: Urgent Care | Admitting: Urgent Care

## 2021-09-03 ENCOUNTER — Other Ambulatory Visit: Payer: Medicare Other

## 2021-09-03 ENCOUNTER — Ambulatory Visit (INDEPENDENT_AMBULATORY_CARE_PROVIDER_SITE_OTHER): Payer: Medicare Other

## 2021-09-03 DIAGNOSIS — R410 Disorientation, unspecified: Secondary | ICD-10-CM | POA: Diagnosis not present

## 2021-09-03 DIAGNOSIS — J189 Pneumonia, unspecified organism: Secondary | ICD-10-CM

## 2021-09-03 DIAGNOSIS — R0602 Shortness of breath: Secondary | ICD-10-CM | POA: Diagnosis not present

## 2021-09-03 DIAGNOSIS — Z8701 Personal history of pneumonia (recurrent): Secondary | ICD-10-CM

## 2021-09-03 LAB — POCT URINALYSIS DIP (MANUAL ENTRY)
Bilirubin, UA: NEGATIVE
Blood, UA: NEGATIVE
Glucose, UA: NEGATIVE mg/dL
Ketones, POC UA: NEGATIVE mg/dL
Nitrite, UA: NEGATIVE
Protein Ur, POC: NEGATIVE mg/dL
Spec Grav, UA: 1.015 (ref 1.010–1.025)
Urobilinogen, UA: 2 E.U./dL — AB
pH, UA: 6.5 (ref 5.0–8.0)

## 2021-09-03 MED ORDER — AMOXICILLIN-POT CLAVULANATE 875-125 MG PO TABS: 1.0000 | ORAL_TABLET | Freq: Two times a day (BID) | ORAL | 0 refills | Status: DC

## 2021-09-03 MED ORDER — BENZONATATE 100 MG PO CAPS: 100.0000 mg | ORAL_CAPSULE | Freq: Three times a day (TID) | ORAL | 0 refills | Status: DC | PRN

## 2021-09-03 NOTE — ED Triage Notes (Signed)
Pt here with SOB and fatigue after having an URI x 1 week. Pt is also a bit altered per daughter. Pt is coughing and it is productive, but secretions are clear. Pt has hx of pneumonia.

## 2021-09-03 NOTE — ED Provider Notes (Signed)
[facility]   MRN: [id number] DOB: 1945/11/11  Subjective:   Cheryl Yu is a 76 y.o. female presenting for 1 week history of persistent shortness of breath, coughing, malaise and fatigue.  Patient presents with her daughter who feels like she is more confused and is worried about pneumonia.  She has previously had a bad bout of this.  At the same time she also had an urinary tract infection.  No history of respiratory disorders.  However, they have noted in a previous x-ray that she had an abnormality in the right upper part of her lung.  This has not been fully detailed.  Patient does have a primary care provider that she has a follow-up appointment with in about 2-3 weeks.  Patient is not a smoker.  No current facility-administered medications for this encounter.  Current Outpatient Medications:    alendronate (FOSAMAX) 70 MG tablet, TAKE 1 TABLET ONCE A WEEK TAKE WITH A FULL GLASS OF WATER ON AN EMPTY STOMACH (PATIENT NEEDS APPOINTMENT BEFORE NEXT REFILL), Disp: 4 tablet, Rfl: 0   ASPIRIN 81 PO, Take by mouth., Disp: , Rfl:    buPROPion ER (WELLBUTRIN SR) 100 MG 12 hr tablet, TAKE 1 TABLET TWICE A DAY, Disp: 60 tablet, Rfl: 0   cholecalciferol (VITAMIN D3) 25 MCG (1000 UNIT) tablet, Take 1,000 Units by mouth daily., Disp: , Rfl:    citalopram (CELEXA) 40 MG tablet, TAKE 1 TABLET DAILY, Disp: 30 tablet, Rfl: 0   cyclobenzaprine (FLEXERIL) 10 MG tablet, Take 1 tablet (10 mg total) by mouth at bedtime. As needed, Disp: 30 tablet, Rfl: 0   famotidine (PEPCID) 20 MG tablet, TAKE 1 TABLET TWICE A DAY, Disp: 60 tablet, Rfl: 0   ibuprofen (ADVIL) 600 MG tablet, Take 1 tablet (600 mg total) by mouth 2 (two) times daily as needed., Disp: 60 tablet, Rfl: 0   meloxicam (MOBIC) 7.5 MG tablet, Take 1 tablet (7.5 mg total) by mouth daily., Disp: 30 tablet, Rfl: 0   Allergies  Allergen Reactions   Codeine Other (See Comments)   Morphine Other (See Comments)    Past Medical  History:  Diagnosis Date   Allergy    Arthritis    Depression    GERD (gastroesophageal reflux disease)    Syncope      Past Surgical History:  Procedure Laterality Date   ABDOMINAL HYSTERECTOMY     Took out Uterus   BREAST ENHANCEMENT SURGERY  1990   silicone   CHOLECYSTECTOMY  1977   complete bladder reconstruction     holes in eyes     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    Family History  Problem Relation Age of Onset   Arthritis Mother    Cancer Mother    Hearing loss Mother    Stroke Mother    Cancer Father    Cancer Sister    Early death Sister    Kidney disease Sister    Depression Daughter    Alcohol abuse Son    Depression Son    Diabetes Son    Arthritis Maternal Grandmother    Heart disease Maternal Grandmother    Cancer Maternal Grandfather    Early death Maternal Grandfather     Social History   Tobacco Use   Smoking status: Never   Smokeless tobacco: Never  Substance Use Topics   Alcohol use: Yes    Comment: 1 glass wine daily   Drug use: Never  ROS   Objective:   Vitals: BP 132/80   Pulse (!) 104   Temp 98.8 F (37.1 C)   Resp 20   SpO2 92%   Physical Exam Constitutional:      General: She is not in acute distress.    Appearance: Normal appearance. She is well-developed. She is not ill-appearing, toxic-appearing or diaphoretic.  HENT:     Head: Normocephalic and atraumatic.     Nose: Nose normal.     Mouth/Throat:     Mouth: Mucous membranes are moist.  Eyes:     General: No scleral icterus.       Right eye: No discharge.        Left eye: No discharge.     Extraocular Movements: Extraocular movements intact.  Cardiovascular:     Rate and Rhythm: Normal rate and regular rhythm.     Heart sounds: Normal heart sounds. No murmur heard.    No friction rub. No gallop.  Pulmonary:     Effort: Pulmonary effort is normal. No respiratory distress.     Breath sounds: No stridor. No wheezing, rhonchi or rales.  Chest:      Chest wall: No tenderness.  Skin:    General: Skin is warm and dry.  Neurological:     General: No focal deficit present.     Mental Status: She is alert and oriented to person, place, and time.  Psychiatric:        Mood and Affect: Mood normal.        Behavior: Behavior normal.     DG Chest 2 View  Result Date: 09/03/2021 CLINICAL DATA:  Short of breath and weakness. EXAM: CHEST - 2 VIEW COMPARISON:  None Available. FINDINGS: Cardiac silhouette is normal in size and configuration. No mediastinal or hilar masses. No evidence of adenopathy. Irregular focal opacity in the posterior right upper lobe, proximally 2.3 cm in size. Remainder of the lungs is clear. No pleural effusion or pneumothorax. Peripherally calcified right breast implant. Skeletal structures are intact. IMPRESSION: 1. Focal opacity in the posterior right upper lobe. Differential diagnosis includes focal infection or inflammation, as well as neoplasm. Recommend nonemergent follow-up chest CT with contrast for further assessment. 2. No other evidence of acute cardiopulmonary disease. Electronically Signed   By: Amie Portland M.D.   On: 09/03/2021 14:24     Results for orders placed or performed during the hospital encounter of 09/03/21 (from the past 24 hour(s))  POCT urinalysis dipstick     Status: Abnormal   Collection Time: 09/03/21  2:27 PM  Result Value Ref Range   Color, UA yellow yellow   Clarity, UA clear clear   Glucose, UA negative negative mg/dL   Bilirubin, UA negative negative   Ketones, POC UA negative negative mg/dL   Spec Grav, UA 5.465 0.354 - 1.025   Blood, UA negative negative   pH, UA 6.5 5.0 - 8.0   Protein Ur, POC negative negative mg/dL   Urobilinogen, UA 2.0 (A) 0.2 or 1.0 E.U./dL   Nitrite, UA Negative Negative   Leukocytes, UA Small (1+) (A) Negative    Assessment and Plan :   PDMP not reviewed this encounter.  1. Community acquired pneumonia of right upper lobe of lung   2. Shortness of  breath   3. History of pneumonia   4. Subacute confusional state    Recommended covering for community-acquired pneumonia with Augmentin.  Use supportive care otherwise.  Hydrate very well.  Urine culture pending.  Follow-up with PCP as soon as possible to pursue an outpatient chest CT scan. Counseled patient on potential for adverse effects with medications prescribed/recommended today, ER and return-to-clinic precautions discussed, patient verbalized understanding.    Jaynee Eagles, PA-C 09/03/21 1438

## 2021-09-04 LAB — URINE CULTURE

## 2021-09-20 ENCOUNTER — Encounter: Payer: Self-pay | Admitting: Family Medicine

## 2021-09-20 ENCOUNTER — Ambulatory Visit (INDEPENDENT_AMBULATORY_CARE_PROVIDER_SITE_OTHER): Payer: Medicare Other | Admitting: Family Medicine

## 2021-09-20 VITALS — BP 122/74 | HR 78 | Temp 97.2°F | Ht 63.0 in | Wt 181.2 lb

## 2021-09-20 DIAGNOSIS — F411 Generalized anxiety disorder: Secondary | ICD-10-CM | POA: Diagnosis not present

## 2021-09-20 DIAGNOSIS — M810 Age-related osteoporosis without current pathological fracture: Secondary | ICD-10-CM | POA: Diagnosis not present

## 2021-09-20 DIAGNOSIS — M19012 Primary osteoarthritis, left shoulder: Secondary | ICD-10-CM | POA: Insufficient documentation

## 2021-09-20 DIAGNOSIS — F32A Depression, unspecified: Secondary | ICD-10-CM

## 2021-09-20 DIAGNOSIS — I809 Phlebitis and thrombophlebitis of unspecified site: Secondary | ICD-10-CM | POA: Insufficient documentation

## 2021-09-20 DIAGNOSIS — Z13220 Encounter for screening for lipoid disorders: Secondary | ICD-10-CM

## 2021-09-20 DIAGNOSIS — M169 Osteoarthritis of hip, unspecified: Secondary | ICD-10-CM | POA: Insufficient documentation

## 2021-09-20 DIAGNOSIS — Z1159 Encounter for screening for other viral diseases: Secondary | ICD-10-CM | POA: Diagnosis not present

## 2021-09-20 LAB — LIPID PANEL
Cholesterol: 212 mg/dL — ABNORMAL HIGH (ref 0–200)
HDL: 72.7 mg/dL (ref >39.00)
LDL Cholesterol: 118 mg/dL — ABNORMAL HIGH (ref 0–99)
NonHDL: 138.93
Total CHOL/HDL Ratio: 3
Triglycerides: 105 mg/dL (ref 0.0–149.0)
VLDL: 21 mg/dL (ref 0.0–40.0)

## 2021-09-20 NOTE — Patient Instructions (Signed)
Stop taking alendronate (Fosamax) Decrease buproprion (Wellbutrin) to once a day for a week, and then stop. Don't take both ibuprofen (Advil, Motrin) and meloxicam (Mobic)

## 2021-09-20 NOTE — Progress Notes (Signed)
[facility] [address] [place] [address] Dept: [phone] Dept Fax: [phone]  Transfer of Care Office Visit  Subjective:    Patient ID: Cheryl Yu, female    DOB: 08-13-1945, 76 y.o..   MRN: [id number]  Chief Complaint  Patient presents with   Establish Care    [facility]- establish care.      History of Present Illness:  Patient is in today to establish care. Cheryl Yu was born in [place], [place]. She lived for a time in [place] and [place], before moving to [place] 3 years ago. She has been married three times. Her first husband died in a car wreck, but she divorced the other two. She has three children: daughter (24) who she lives with, a son (80) and a daughter (1). The older two children were with her first husband. She has two grandchildren and 1 great-grandchild. 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 She denies use of tobacco, alcohol, or drugs.  Cheryl Yu has a history of osteoporosis. She has been being managed on alendronate 70 mg weekly. She started on this about 6 years ago. She also takes a Vitamin D supplement. She has no history of a osteoporotic fracture.  Cheryl Yu has a history of generalized anxiety with some depressive symptoms. She is currently manage don bupropion XL 300 mg twice a day and citalopram  40 mg daily. She feels this does not work for her anxiety as well as it did in the past.  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 She takes a daily aspirin. She also has some GERD, so is on Pepcid to help manage this and prevent ulcers.  Cheryl Yu was seen in July with right hip and left shoulder pain. She has been on meloxicam and Flexeril. This initially did worse, but she had a concurrent respiratory infection which may have impacted this. She has noted improvements more recently. She is worried about  recurrence.  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 12/07/2020   Gastroesophageal reflux disease without esophagitis 12/07/2020   Generalized anxiety disorder 12/07/2020   Depression 02/21/2019   Past Surgical History:  Procedure Laterality Date   ABDOMINAL HYSTERECTOMY     Took out Uterus   BREAST ENHANCEMENT SURGERY  1990   silicone   CHOLECYSTECTOMY  1977   Open   complete bladder reconstruction     TRABECULECTOMY     Family History  Problem Relation Age of Onset   Arthritis Mother    Cancer Mother        Ovarian   Hearing loss Mother    Stroke Mother    COPD Father    Cancer Sister        Pancreatic   Early death Sister    Kidney disease Sister    Depression Daughter    Alcohol abuse Son    Depression Son    Diabetes Son    Arthritis Maternal Grandmother    Heart disease Maternal Grandmother    Cancer Maternal Grandfather    Early death Maternal Grandfather    Outpatient Medications Prior to Visit  Medication Sig Dispense Refill   ASPIRIN 81 PO Take by mouth.     cholecalciferol (VITAMIN D3) 25 MCG (1000 UNIT) tablet Take  1,000 Units by mouth daily.     citalopram (CELEXA) 40 MG tablet TAKE 1 TABLET DAILY 30 tablet 0   cyclobenzaprine (FLEXERIL) 10 MG tablet Take 1 tablet (10 mg total) by mouth at bedtime. As needed 30 tablet 0   famotidine (PEPCID) 20 MG tablet TAKE 1 TABLET TWICE A DAY 60 tablet 0   meloxicam (MOBIC) 7.5 MG tablet Take 1 tablet (7.5 mg total) by mouth daily. 30 tablet 0   alendronate (FOSAMAX) 70 MG tablet TAKE 1 TABLET ONCE A WEEK TAKE WITH A FULL GLASS OF WATER ON AN EMPTY STOMACH (PATIENT NEEDS APPOINTMENT BEFORE NEXT REFILL) 4 tablet 0   benzonatate (TESSALON) 100 MG capsule Take 1 capsule (100 mg total) by mouth 3 (three) times daily as needed for cough. 30 capsule 0   buPROPion ER (WELLBUTRIN SR)  100 MG 12 hr tablet TAKE 1 TABLET TWICE A DAY 60 tablet 0   ibuprofen (ADVIL) 600 MG tablet Take 1 tablet (600 mg total) by mouth 2 (two) times daily as needed. 60 tablet 0   amoxicillin-clavulanate (AUGMENTIN) 875-125 MG tablet Take 1 tablet by mouth 2 (two) times daily. 20 tablet 0   No facility-administered medications prior to visit.   Allergies  Allergen Reactions   Codeine Other (See Comments)   Morphine Other (See Comments)     Objective:   Today's Vitals   09/20/21 0821  BP: 122/74  Pulse: 78  Temp: (!) 97.2 F (36.2 C)  TempSrc: Temporal  SpO2: 97%  Weight: 181 lb 3.2 oz (82.2 kg)  Height: 5\' 3"  (1.6 m)   Body mass index is 32.1 kg/m.   General: Well developed, well nourished. No acute distress. Psych: Alert and oriented. Normal mood and affect.  Health Maintenance Due  Topic Date Due   Hepatitis C Screening  Never done   TETANUS/TDAP  Never done   Zoster Vaccines- Shingrix (1 of 2) Never done   Pneumonia Vaccine 53+ Years old (1 - PCV) Never done   INFLUENZA VACCINE  08/30/2021   Imaging: Bone Density ( 06/22/2021) ASSESSMENT: The BMD measured at Femur Neck Right is 0.740 g/cm2 with a T-score of -2.1. This patient is considered osteopenic/low bone mass according to World Health Organization [facility]) criteria.   The quality of the exam is good. L4 was excluded due to degenerative changes. The patient does not meet the FRAX criteria.   Site Region Measured Date Measured Age YA BMD Significant CHANGE T-score DualFemur Neck Right 06/22/2021 76.3 -2.1 0.740 g/cm2   AP Spine L1-L3 06/22/2021 76.3 -1.0 1.054 g/cm2   DualFemur Total Mean 06/22/2021 76.3 -1.5 0.820 g/cm2   Left Forearm Radius 33% 06/22/2021 76.3 -1.6 0.737 g/cm2   Assessment & Plan:   1. 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 I explained the ongoing benefit for up to five years of having completed this therapy. We will plan a follow up bone density in 2025.  2. Depression, unspecified depression type 3. Generalized anxiety disorder The bupropion could be worsening anxiety feelings for Ms. 06/24/2021. I recommend she taper and stop her bupropion. I will plan to see her back in a month. If her anxiety remains an issue, we will address if she needs other changes to her meds. For now, she will continue the citalopram  40 mg daily.  4. Encounter for hepatitis C screening test for low risk patient  - HCV Ab w Reflex to Quant PCR  6. Screening for lipid disorders  - Lipid panel   Return in about 4 weeks (around 10/18/2021) for Reassessment.   Loyola Mast, MD

## 2021-09-21 LAB — HCV INTERPRETATION

## 2021-09-21 LAB — HCV AB W REFLEX TO QUANT PCR: HCV Ab: NONREACTIVE

## 2021-10-04 ENCOUNTER — Other Ambulatory Visit: Payer: Self-pay | Admitting: Family Medicine

## 2021-10-04 DIAGNOSIS — K219 Gastro-esophageal reflux disease without esophagitis: Secondary | ICD-10-CM

## 2021-10-04 DIAGNOSIS — F411 Generalized anxiety disorder: Secondary | ICD-10-CM

## 2021-10-18 ENCOUNTER — Ambulatory Visit (INDEPENDENT_AMBULATORY_CARE_PROVIDER_SITE_OTHER): Payer: Medicare Other | Admitting: Family Medicine

## 2021-10-18 VITALS — BP 134/80 | HR 91 | Temp 97.7°F | Wt 177.0 lb

## 2021-10-18 DIAGNOSIS — R9389 Abnormal findings on diagnostic imaging of other specified body structures: Secondary | ICD-10-CM

## 2021-10-18 DIAGNOSIS — R051 Acute cough: Secondary | ICD-10-CM | POA: Diagnosis not present

## 2021-10-18 DIAGNOSIS — M19012 Primary osteoarthritis, left shoulder: Secondary | ICD-10-CM

## 2021-10-18 MED ORDER — ALBUTEROL SULFATE HFA 108 (90 BASE) MCG/ACT IN AERS: 2.0000 | INHALATION_SPRAY | Freq: Four times a day (QID) | RESPIRATORY_TRACT | 0 refills | Status: DC | PRN

## 2021-10-18 MED ORDER — PREDNISONE 20 MG PO TABS: 20.0000 mg | ORAL_TABLET | Freq: Every day | ORAL | 0 refills | Status: DC

## 2021-10-18 NOTE — Progress Notes (Unsigned)
[facility] [address] [place] [address] Dept: [phone] Dept Fax: [phone]  Office Visit  Subjective:    Patient ID: Cheryl Yu, female    DOB: December 31, 1945, 76 y.o..   MRN: [id number]  Chief Complaint  Patient presents with   Acute Visit    C/o cough/congestion x 1 month.  She has been taking Nyquil, and OTC  cough meds.  Also having LT arm pain and hip pain.       History of Present Illness:  Patient is in today complaining of a 27-month history of cough. She notes this is esp. bad at night. She has tried lozenges for this. She was seen in Urgent Care on 8/5. They treated her with a course of Augmentin for what they felt was a pneumonia. She was noted on CXR to have an abnormal area of infiltrate in the right upper lobe. She recalls having pneumonia about 4 years ago, while living in [place]. This had occurred soone after moving from [place]. She thinks this may have impacted a similar part of the lung.   Ms. Earnhardt also complains of pain in the left upper arm/shoulder area. She saw Dr. Ethelene Hal for this in late July. she was on a course of Mobic, but has now stopped this. She relates the arm pain as to hurting at the site where she received COVID vaccines and wonders if this is related.  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/20/2021   Degenerative arthritis of hip- Right 09/20/2021   Degenerative arthritis of left shoulder region 09/20/2021   Osteoporosis without current pathological fracture 12/07/2020   Gastroesophageal reflux disease without esophagitis 12/07/2020   Generalized anxiety disorder 12/07/2020   Depression 02/21/2019   Past Surgical History:  Procedure Laterality Date   ABDOMINAL HYSTERECTOMY     Took out Uterus   BREAST ENHANCEMENT SURGERY  2035   silicone   CHOLECYSTECTOMY  1977   Open   complete bladder  reconstruction     TRABECULECTOMY     Family History  Problem Relation Age of Onset   Arthritis Mother    Cancer Mother        Ovarian   Hearing loss Mother    Stroke Mother    COPD Father    Cancer Sister        Pancreatic   Early death Sister    Kidney disease Sister    Depression Daughter    Alcohol abuse Son    Depression Son    Diabetes Son    Arthritis Maternal Grandmother    Heart disease Maternal Grandmother    Cancer Maternal Grandfather    Early death Maternal Grandfather    Outpatient Medications Prior to Visit  Medication Sig Dispense Refill   ASPIRIN 81 PO Take by mouth.     cholecalciferol (VITAMIN D3) 25 MCG (1000 UNIT) tablet Take 1,000 Units by mouth daily.     citalopram (CELEXA) 40 MG tablet TAKE 1 TABLET DAILY 30 tablet 11   famotidine (PEPCID) 20 MG tablet TAKE 1 TABLET TWICE A DAY 60 tablet 11   cyclobenzaprine (FLEXERIL) 10 MG tablet Take 1 tablet (10 mg total) by mouth at bedtime. As needed 30 tablet 0   meloxicam (MOBIC) 7.5 MG tablet Take 1 tablet (7.5 mg total) by mouth daily. 30 tablet 0   No facility-administered medications prior to visit.   Allergies  Allergen  Reactions   Codeine Other (See Comments)   Morphine Other (See Comments)    Objective:   Today's Vitals   10/18/21 2101  BP: 134/80  Pulse: 91  Temp: 97.7 F (36.5 C)  SpO2: 97%  Weight: 177 lb (80.3 kg)   Body mass index is 31.35 kg/m.   General: Well developed, well nourished. No acute distress. HEENT: Normocephalic, non-traumatic. Conjunctiva clear. External ears normal. EAC and TMs normal bilaterally. Nose    clear without congestion or rhinorrhea. Mucous membranes moist. Oropharynx clear. Good dentition. Neck: Supple. No lymphadenopathy. No thyromegaly. Lungs: There are a few scattered musical wheezes present in both lower lung fields. No rales or rhonchi. CV: RRR without murmurs or rubs. Pulses 2+ bilaterally. Psych: Alert and oriented. Normal mood and  affect.  Health Maintenance Due  Topic Date Due   TETANUS/TDAP  Never done   Zoster Vaccines- Shingrix (1 of 2) Never done   Pneumonia Vaccine 74+ Years old (1 - PCV) Never done   COVID-19 Vaccine (3 - Pfizer series) 08/21/2019   INFLUENZA VACCINE  08/30/2021   Imaging: Chest x-ray (09/03/2021) IMPRESSION: 1. Focal opacity in the posterior right upper lobe. Differential diagnosis includes focal infection or inflammation, as well as neoplasm. Recommend nonemergent follow-up chest CT with contrast for further assessment. 2. No other evidence of acute cardiopulmonary disease.  Lab results: POCT COVID- Neg.    Assessment & Plan:   1. Acute cough There appears to be some wheezing present. I will give Ms. Zumstein a trial of prednisone and an albuterol inhaler. I will plan to see her back in 2 weeks. If not improved, would consider a pulmonology referral for PFTs.  - POC COVID-19 - predniSONE (DELTASONE) 20 MG tablet; Take 1 tablet (20 mg total) by mouth daily with breakfast.  Dispense: 7 tablet; Refill: 0 - albuterol (VENTOLIN HFA) 108 (90 Base) MCG/ACT inhaler; Inhale 2 puffs into the lungs every 6 (six) hours as needed for wheezing or shortness of breath.  Dispense: 8 g; Refill: 0  2. Abnormal chest x-ray I will order a follow-up CT scan as recommended.  - CT Chest W Contrast; Future  3. Primary osteoarthritis of left shoulder We discussed that her x-rays had shown degenerative arthritis changes in the shoulder. I do not feel this is related to her prior COVID vaccines. I pointed out that she had likely received many other vaccines in that same shoulder location over the years, which she confirmed.   Return in about 2 weeks (around 11/01/2021) for Reassessment.   Haydee Salter, MD

## 2021-10-19 LAB — POC COVID19 BINAXNOW: SARS Coronavirus 2 Ag: NEGATIVE

## 2021-10-25 ENCOUNTER — Ambulatory Visit: Payer: Medicare Other | Admitting: Family Medicine

## 2021-10-31 ENCOUNTER — Ambulatory Visit (INDEPENDENT_AMBULATORY_CARE_PROVIDER_SITE_OTHER): Payer: Medicare Other | Admitting: Family Medicine

## 2021-10-31 ENCOUNTER — Encounter: Payer: Self-pay | Admitting: Family Medicine

## 2021-10-31 VITALS — BP 132/76 | HR 55 | Temp 96.6°F | Ht 63.0 in | Wt 178.2 lb

## 2021-10-31 DIAGNOSIS — M19012 Primary osteoarthritis, left shoulder: Secondary | ICD-10-CM | POA: Diagnosis not present

## 2021-10-31 DIAGNOSIS — G2581 Restless legs syndrome: Secondary | ICD-10-CM | POA: Diagnosis not present

## 2021-10-31 DIAGNOSIS — R051 Acute cough: Secondary | ICD-10-CM | POA: Diagnosis not present

## 2021-10-31 DIAGNOSIS — M1611 Unilateral primary osteoarthritis, right hip: Secondary | ICD-10-CM

## 2021-10-31 LAB — CBC
HCT: 39.1 % (ref 36.0–46.0)
Hemoglobin: 12.9 g/dL (ref 12.0–15.0)
MCHC: 33 g/dL (ref 30.0–36.0)
MCV: 90.7 fl (ref 78.0–100.0)
Platelets: 263 10*3/uL (ref 150.0–400.0)
RBC: 4.31 Mil/uL (ref 3.87–5.11)
RDW: 14.4 % (ref 11.5–15.5)
WBC: 6.2 10*3/uL (ref 4.0–10.5)

## 2021-10-31 NOTE — Progress Notes (Signed)
[facility] [address] [place] [address] Dept: [phone] Dept Fax: [phone]  Office Visit  Subjective:    Patient ID: Cheryl Yu, female    DOB: [birth year], 76 y.o..   MRN: [id number]  Chief Complaint  Patient presents with   Follow-up    4 week f/u.  Pain is coming back and has been feeling fatigue.     History of Present Illness:  Patient is in today for reassessment of her cough. I had seen Cheryl Yu 2 weeks ago. She had had a 4-month history of cough. She noted this is esp. bad at night. She was seen in Urgent Care on 8/5. They treated her with a course of Augmentin for what they felt was a pneumonia. She was noted on CXR to have an abnormal area of infiltrate in the right upper lobe. As she had some wheezing present, I treated her with a course of prednisone. Cheryl Yu notes the cough has improved. She found that she did not need to use the albuterol inhaler once she was on the prednisone. She is still having a small amount of mucous.  A side benefit of the prednisone is that it seemed to help her arthritic complaints. She notes she felt less joint pain, so was able to exercise better. She has arthritis in both her shoulder and hips. She wishes she could stay on the prednisone.  Cheryl Yu notes an issues with restless legs. She finds that 3-4 nights a week,s he has difficulty getting to sleep because of this.  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/20/2021   Degenerative arthritis of hip- Right 09/20/2021   Degenerative arthritis of left shoulder region 09/20/2021   Osteoporosis without current pathological fracture 12/07/2020   Gastroesophageal reflux disease without esophagitis 12/07/2020   Generalized anxiety disorder 12/07/2020   Depression 02/21/2019   Past Surgical History:  Procedure Laterality Date   ABDOMINAL  HYSTERECTOMY     Took out Uterus   BREAST ENHANCEMENT SURGERY  1990   silicone   CHOLECYSTECTOMY  1977   Open   complete bladder reconstruction     TRABECULECTOMY     Family History  Problem Relation Age of Onset   Arthritis Mother    Cancer Mother        Ovarian   Hearing loss Mother    Stroke Mother    COPD Father    Cancer Sister        Pancreatic   Early death Sister    Kidney disease Sister    Depression Daughter    Alcohol abuse Son    Depression Son    Diabetes Son    Arthritis Maternal Grandmother    Heart disease Maternal Grandmother    Cancer Maternal Grandfather    Early death Maternal Grandfather    Outpatient Medications Prior to Visit  Medication Sig Dispense Refill   albuterol (VENTOLIN HFA) 108 (90 Base) MCG/ACT inhaler Inhale 2 puffs into the lungs every 6 (six) hours as needed for wheezing or shortness of breath. 8 g 0   ASPIRIN 81 PO Take by mouth.     cholecalciferol (VITAMIN D3) 25 MCG (1000 UNIT) tablet Take 1,000 Units by mouth daily.     citalopram (CELEXA) 40 MG tablet TAKE 1 TABLET DAILY 30 tablet 11   famotidine (PEPCID) 20 MG tablet TAKE 1 TABLET TWICE A DAY 60 tablet 11  predniSONE (DELTASONE) 20 MG tablet Take 1 tablet (20 mg total) by mouth daily with breakfast. 7 tablet 0   No facility-administered medications prior to visit.   Allergies  Allergen Reactions   Codeine Other (See Comments)   Morphine Other (See Comments)    Objective:   Today's Vitals   10/31/21 0801  BP: 132/76  Pulse: (!) 55  Temp: (!) 96.6 F (35.9 C)  TempSrc: Temporal  SpO2: 99%  Weight: 178 lb 3.2 oz (80.8 kg)  Height: 5\' 3"  (1.6 m)   Body mass index is 31.57 kg/m.   General: Well developed, well nourished. No acute distress. Lungs: Clear to auscultation bilaterally. No wheezing, rales or rhonchi. Psych: Alert and oriented. Normal mood and affect.  Health Maintenance Due  Topic Date Due   TETANUS/TDAP  Never done   Zoster Vaccines- Shingrix (1  of 2) Never done   Pneumonia Vaccine 23+ Years old (1 - PCV) Never done     Assessment & Plan:   1. Acute cough Improved after a course of prednisone. We will follow this for now.  2. Restless legs I will check an iron level to make sure this is not an underlying cause. I would consider other therapies if iron replacement is not needed.  - CBC - Iron, TIBC and Ferritin Panel  3. Primary osteoarthritis of right hip 4. Primary osteoarthritis of left shoulder We discussed that ongoing prednisone would carry too high a risk for her. I recommend she consider tumeric and remain active. She will let me know when things reach a point that she might consider surgical interventions.  Return in about 3 months (around 01/31/2022) for Reassessment.   Haydee Salter, MD

## 2021-11-01 LAB — IRON,TIBC AND FERRITIN PANEL
%SAT: 30 % (calc) (ref 16–45)
Ferritin: 64 ng/mL (ref 16–288)
Iron: 82 ug/dL (ref 45–160)
TIBC: 274 mcg/dL (calc) (ref 250–450)

## 2021-11-01 MED ORDER — IRON (FERROUS SULFATE) 325 (65 FE) MG PO TABS: 325.0000 mg | ORAL_TABLET | Freq: Every day | ORAL | 5 refills | Status: DC

## 2021-11-01 NOTE — Addendum Note (Signed)
Addended by: Haydee Salter on: 11/01/2021 04:41 PM   Modules accepted: Orders

## 2021-11-10 ENCOUNTER — Other Ambulatory Visit: Payer: Self-pay | Admitting: Family Medicine

## 2021-11-10 DIAGNOSIS — R051 Acute cough: Secondary | ICD-10-CM

## 2021-11-14 ENCOUNTER — Ambulatory Visit
Admission: RE | Admit: 2021-11-14 | Discharge: 2021-11-14 | Disposition: A | Discharge status: Home / Self Care | Payer: Medicare Other | Source: Ambulatory Visit | Attending: Family Medicine | Admitting: Family Medicine

## 2021-11-14 DIAGNOSIS — R9389 Abnormal findings on diagnostic imaging of other specified body structures: Secondary | ICD-10-CM

## 2021-11-14 MED ORDER — IOPAMIDOL (ISOVUE-300) INJECTION 61%
75.0000 mL | Freq: Once | INTRAVENOUS | Status: AC | PRN
  Administered 2021-11-14: 75 mL via INTRAVENOUS

## 2021-11-22 ENCOUNTER — Telehealth: Payer: Self-pay

## 2021-11-22 NOTE — Telephone Encounter (Signed)
Patient is calling in stating that her restless legs are getting worse. She is in a lot of pain due to her hip and thinks it may be caused by the restless legs. Wants to know if there is any medication she can take for the issues.

## 2021-11-22 NOTE — Telephone Encounter (Signed)
Spoke to patient and advised her fo results of the CT Lung that was done and scheduled a f/u appt with Dr Gena Fray on 12/05/21 @9 :20 am .Dm/cma

## 2021-12-06 ENCOUNTER — Encounter: Payer: Self-pay | Admitting: Family Medicine

## 2021-12-06 ENCOUNTER — Ambulatory Visit (INDEPENDENT_AMBULATORY_CARE_PROVIDER_SITE_OTHER): Payer: Medicare Other | Admitting: Family Medicine

## 2021-12-06 VITALS — BP 134/78 | HR 64 | Temp 96.6°F | Ht 63.0 in | Wt 177.2 lb

## 2021-12-06 DIAGNOSIS — G2581 Restless legs syndrome: Secondary | ICD-10-CM | POA: Diagnosis not present

## 2021-12-06 DIAGNOSIS — M19012 Primary osteoarthritis, left shoulder: Secondary | ICD-10-CM | POA: Diagnosis not present

## 2021-12-06 DIAGNOSIS — M1611 Unilateral primary osteoarthritis, right hip: Secondary | ICD-10-CM | POA: Diagnosis not present

## 2021-12-06 MED ORDER — ROPINIROLE HCL 0.25 MG PO TABS: ORAL_TABLET | ORAL | 0 refills | Status: DC

## 2021-12-06 MED ORDER — ROPINIROLE HCL 1 MG PO TABS: 1.0000 mg | ORAL_TABLET | Freq: Every day | ORAL | 3 refills | Status: DC

## 2021-12-06 NOTE — Progress Notes (Signed)
[facility] [address] [place] [address] Dept: [phone] Dept Fax: [phone]  Chronic Care Office Visit  Subjective:    Patient ID: Cheryl Yu, female    DOB: [birth year], 76 y.o..   MRN: [id number]  Chief Complaint  Patient presents with   Follow-up    F/u, c/o having restless legs, pain in hip/shoulder.     History of Present Illness:  Patient is in today for reassessment of chronic medical issues.  At her last visit, Cheryl Yu noted an issues with restless legs. She was finding that 3-4 nights a week, she has difficulty getting to sleep because of this. She describes a feeling like she needs to get up and run to relieve the pressure she feels in her legs. She wonders if the frequent movement of the legs at night is exacerbating her hip issues.  Cheryl Yu was seen in July with right hip and left shoulder pain. She has been on meloxicam and Flexeril. She notes that the pain flares periodically and wonders about what she can do in the long run to manage this.  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/20/2021   Degenerative arthritis of hip- Right 09/20/2021   Degenerative arthritis of left shoulder region 09/20/2021   Osteoporosis without current pathological fracture 12/07/2020   Gastroesophageal reflux disease without esophagitis 12/07/2020   Generalized anxiety disorder 12/07/2020   Depression 02/21/2019   Past Surgical History:  Procedure Laterality Date   ABDOMINAL HYSTERECTOMY     Took out Uterus   BREAST ENHANCEMENT SURGERY  6967   silicone   CHOLECYSTECTOMY  1977   Open   complete bladder reconstruction     TRABECULECTOMY     Family History  Problem Relation Age of Onset   Arthritis Mother    Cancer Mother        Ovarian   Hearing loss Mother    Stroke Mother    COPD Father    Cancer Sister         Pancreatic   Early death Sister    Kidney disease Sister    Depression Daughter    Alcohol abuse Son    Depression Son    Diabetes Son    Arthritis Maternal Grandmother    Heart disease Maternal Grandmother    Cancer Maternal Grandfather    Early death Maternal Grandfather    Outpatient Medications Prior to Visit  Medication Sig Dispense Refill   ASPIRIN 81 PO Take by mouth.     cholecalciferol (VITAMIN D3) 25 MCG (1000 UNIT) tablet Take 1,000 Units by mouth daily.     citalopram (CELEXA) 40 MG tablet TAKE 1 TABLET DAILY 30 tablet 11   famotidine (PEPCID) 20 MG tablet TAKE 1 TABLET TWICE A DAY 60 tablet 11   Iron, Ferrous Sulfate, 325 (65 Fe) MG TABS Take 325 mg by mouth daily. 30 tablet 5   Multiple Vitamin (MULTIVITAMIN) tablet Take 1 tablet by mouth daily.     VENTOLIN HFA 108 (90 Base) MCG/ACT inhaler TAKE 2 PUFFS BY MOUTH EVERY 6 HOURS AS NEEDED FOR WHEEZE OR SHORTNESS OF BREATH 18 each 1   No facility-administered medications prior to visit.   Allergies  Allergen Reactions   Codeine Other (See Comments)   Morphine Other (See Comments)     Objective:   Today's Vitals   12/06/21 0937  BP: 134/78  Pulse: 64  Temp: (!) 96.6 F (35.9 C)  TempSrc: Temporal  SpO2: 98%  Weight: 177 lb 3.2 oz (80.4 kg)  Height: 5\' 3"  (1.6 m)   Body mass index is 31.39 kg/m.   General: Well developed, well nourished. No acute distress. Psych: Alert and oriented. Normal mood and affect.  Health Maintenance Due  Topic Date Due   TETANUS/TDAP  Never done   Zoster Vaccines- Shingrix (1 of 2) Never done   Pneumonia Vaccine 64+ Years old (1 - PCV) Never done   Lab Results    Latest Ref Rng & Units 10/31/2021    8:30 AM  CBC  WBC 4.0 - 10.5 K/uL 6.2   Hemoglobin 12.0 - 15.0 g/dL 12/31/2021   Hematocrit 38.1 - 46.0 % 39.1   Platelets 150.0 - 400.0 K/uL 263.0    Iron/TIBC/Ferritin/ %Sat    Component Value Date/Time   IRON 82 10/31/2021 0830   TIBC 274 10/31/2021 0830   FERRITIN 64  10/31/2021 0830   IRONPCTSAT 30 10/31/2021 0830     Assessment & Plan:   1. Restless legs No sign of iron deficiency. We discussed starting on ropinirole for this. She will titrate up on the dose over a week. I will plan to see her back 6 weeks to assess efficacy.  - rOPINIRole (REQUIP) 0.25 MG tablet; Take 1 tablet (0.25 mg total) by mouth at bedtime for 2 days, THEN 2 tablets (0.5 mg total) at bedtime for 5 days.  Dispense: 12 tablet; Refill: 0 - rOPINIRole (REQUIP) 1 MG tablet; Take 1 tablet (1 mg total) by mouth at bedtime.  Dispense: 30 tablet; Refill: 3  2. Primary osteoarthritis of right hip 3. Primary osteoarthritis of left shoulder Recommend use of OTC Aleve 220 mg bid for flares. She should also be applying heat to these joints. I recommend regular exercise, swimming being a good alternative to off load her hip. If not improving,w e could consider PT or orthopedic referral.  Return in about 6 weeks (around 01/17/2022) for Reassessment.   01/19/2022, MD

## 2022-01-09 ENCOUNTER — Ambulatory Visit: Payer: Medicare Other | Admitting: Family Medicine

## 2022-01-09 ENCOUNTER — Telehealth: Payer: Self-pay | Admitting: Family Medicine

## 2022-01-09 NOTE — Telephone Encounter (Signed)
Pt was a no show for an OV with Dr. Veto Kemps on 01/09/22, I sent a letter.

## 2022-01-11 ENCOUNTER — Ambulatory Visit (INDEPENDENT_AMBULATORY_CARE_PROVIDER_SITE_OTHER): Payer: Medicare Other | Admitting: Family Medicine

## 2022-01-11 VITALS — BP 118/74 | Temp 97.3°F | Ht 63.0 in | Wt 174.4 lb

## 2022-01-11 DIAGNOSIS — G2581 Restless legs syndrome: Secondary | ICD-10-CM | POA: Diagnosis not present

## 2022-01-11 DIAGNOSIS — F411 Generalized anxiety disorder: Secondary | ICD-10-CM

## 2022-01-11 DIAGNOSIS — K219 Gastro-esophageal reflux disease without esophagitis: Secondary | ICD-10-CM | POA: Diagnosis not present

## 2022-01-11 MED ORDER — FAMOTIDINE 20 MG PO TABS: 20.0000 mg | ORAL_TABLET | Freq: Two times a day (BID) | ORAL | 11 refills | Status: DC

## 2022-01-11 MED ORDER — BUPROPION HCL ER (SR) 150 MG PO TB12: ORAL_TABLET | ORAL | 0 refills | Status: DC

## 2022-01-11 NOTE — Telephone Encounter (Signed)
1st no show, fee waived, letter sent 

## 2022-01-11 NOTE — Progress Notes (Signed)
[facility] [address] [place] [address] Dept: [phone] Dept Fax: [phone]  Chronic Care Office Visit  Subjective:    Patient ID: Cheryl Yu, female    DOB: 1945/03/28, 76 y.o..   MRN: [id number]  Chief Complaint  Patient presents with   Follow-up    F/u meds.   Wants to change anxiety meds    History of Present Illness:  Patient is in today for reassessment of chronic medical issues.  I had recently diagnosed Cheryl Yu with restless legs. She is now managed on a daily iron supplement and ropinirole 1 mg at bedtime. She is very pleased with her response to this regimen. She finds she is sleeping better now.  Cheryl Yu has a history of generalized anxiety with some depressive symptoms. She is currently managed citalopram 40 mg daily. She feels this is well managed. However, she  notes that her daughters, who are managed on bupropion for other issues, have had associated weight loss. She is very worried about her current weight. She asks about switching form citalopram to bupropion.  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/20/2021   Degenerative arthritis of hip- Right 09/20/2021   Degenerative arthritis of left shoulder region 09/20/2021   Osteoporosis without current pathological fracture 12/07/2020   Gastroesophageal reflux disease without esophagitis 12/07/2020   Generalized anxiety disorder 12/07/2020   Past Surgical History:  Procedure Laterality Date   ABDOMINAL HYSTERECTOMY     Took out Uterus   BREAST ENHANCEMENT SURGERY  1990   silicone   CHOLECYSTECTOMY  1977   Open   complete bladder reconstruction     TRABECULECTOMY     Family History  Problem Relation Age of Onset   Arthritis Mother    Cancer Mother        Ovarian   Hearing loss Mother    Stroke Mother    COPD Father    Cancer Sister         Pancreatic   Early death Sister    Kidney disease Sister    Depression Daughter    Alcohol abuse Son    Depression Son    Diabetes Son    Arthritis Maternal Grandmother    Heart disease Maternal Grandmother    Cancer Maternal Grandfather    Early death Maternal Grandfather    Outpatient Medications Prior to Visit  Medication Sig Dispense Refill   ASPIRIN 81 PO Take by mouth.     cholecalciferol (VITAMIN D3) 25 MCG (1000 UNIT) tablet Take 1,000 Units by mouth daily.     famotidine (PEPCID) 20 MG tablet TAKE 1 TABLET TWICE A DAY 60 tablet 11   Iron, Ferrous Sulfate, 325 (65 Fe) MG TABS Take 325 mg by mouth daily. 30 tablet 5   Multiple Vitamin (MULTIVITAMIN) tablet Take 1 tablet by mouth daily.     rOPINIRole (REQUIP) 1 MG tablet Take 1 tablet (1 mg total) by mouth at bedtime. 30 tablet 3   VENTOLIN HFA 108 (90 Base) MCG/ACT inhaler TAKE 2 PUFFS BY MOUTH EVERY 6 HOURS AS NEEDED FOR WHEEZE OR SHORTNESS OF BREATH 18 each 1   citalopram (CELEXA) 40 MG tablet TAKE 1 TABLET DAILY 30 tablet 11   rOPINIRole (REQUIP) 0.25 MG tablet Take 1 tablet (0.25 mg total) by mouth at bedtime for 2 days, THEN 2 tablets (0.5 mg total) at bedtime for 5 days. 12 tablet 0  No facility-administered medications prior to visit.   Allergies  Allergen Reactions   Codeine Other (See Comments)   Morphine Other (See Comments)     Objective:   Today's Vitals   01/11/22 1444  BP: 118/74  Temp: (!) 97.3 F (36.3 C)  TempSrc: Temporal  Weight: 174 lb 6.4 oz (79.1 kg)  Height: 5\' 3"  (1.6 m)   Body mass index is 30.89 kg/m.   General: Well developed, well nourished. No acute distress. Psych: Alert and oriented. Normal mood and affect.  Health Maintenance Due  Topic Date Due   DTaP/Tdap/Td (1 - Tdap) Never done   Zoster Vaccines- Shingrix (1 of 2) Never done   Pneumonia Vaccine 76+ Years old (1 - PCV) Never done     Assessment & Plan:   1. Restless legs Improved. She should continue her daily iron  supplement and ropinirole 1 mg at bedtime.  2. Gastroesophageal reflux disease without esophagitis Stable. I will renew her Pepcid.  - famotidine (PEPCID) 20 MG tablet; Take 1 tablet (20 mg total) by mouth 2 (two) times daily.  Dispense: 60 tablet; Refill: 11  3. Generalized anxiety disorder We will cautiously try switching her meds.  Reduce citalopram 40 mg to 1/2 tab daily for 1 week, then stop. After 24 hours off of citalopram, start bupropion. Take bupropion SR 150 mg, 1 tab daily for 7 days, then move to 1 tab twice a day.  - buPROPion (WELLBUTRIN SR) 150 MG 12 hr tablet; Take 1 tablet (150 mg total) by mouth daily for 7 days, THEN 1 tablet (150 mg total) 2 (two) times daily  Dispense: 21 tablet; Refill: 0   Return in about 6 weeks (around 02/22/2022) for Reassessment.   02/24/2022, MD

## 2022-01-11 NOTE — Patient Instructions (Signed)
Reduce citalopram 40 mg to 1/2 tab daily for 1 week, then stop. After 24 hours off of citalopram, start bupropion. Take bupropion SR 150 mg, 1 tab daily for 7 days, then move to 1 tab twice a day.

## 2022-01-19 ENCOUNTER — Telehealth: Payer: Self-pay | Admitting: Family Medicine

## 2022-01-19 NOTE — Telephone Encounter (Signed)
Pt was asked to change meds and now she is breaking out in hives and pt do not know what to do

## 2022-01-19 NOTE — Telephone Encounter (Signed)
Spoke to patient, she reports having blister/rash on neck, back, chest arm (all Rt side) x 1 days.  Very painfully.  Scheduled her an appointment 01/20/22 @ 8:00 am.  Dm/cma

## 2022-01-20 ENCOUNTER — Ambulatory Visit (INDEPENDENT_AMBULATORY_CARE_PROVIDER_SITE_OTHER): Payer: Medicare Other | Admitting: Family Medicine

## 2022-01-20 ENCOUNTER — Encounter: Payer: Self-pay | Admitting: Family Medicine

## 2022-01-20 VITALS — BP 122/80 | HR 89 | Temp 96.5°F | Ht 63.0 in | Wt 172.8 lb

## 2022-01-20 DIAGNOSIS — B029 Zoster without complications: Secondary | ICD-10-CM

## 2022-01-20 MED ORDER — TRAMADOL HCL 50 MG PO TABS: 50.0000 mg | ORAL_TABLET | Freq: Three times a day (TID) | ORAL | 0 refills | Status: DC | PRN

## 2022-01-20 MED ORDER — VALACYCLOVIR HCL 1 G PO TABS: 1000.0000 mg | ORAL_TABLET | Freq: Three times a day (TID) | ORAL | 0 refills | Status: DC

## 2022-01-20 MED ORDER — VALACYCLOVIR HCL 1 G PO TABS: 1000.0000 mg | ORAL_TABLET | Freq: Three times a day (TID) | ORAL | 0 refills | Status: AC

## 2022-01-20 NOTE — Addendum Note (Signed)
Addended by: Loyola Mast on: 01/20/2022 10:15 AM   Modules accepted: Orders

## 2022-01-20 NOTE — Progress Notes (Signed)
[facility] [address] [place] [address] Dept: [phone] Dept Fax: [phone]  Office Visit  Subjective:    Patient ID: Cheryl Yu, female    DOB: [birth year], 76 y.o..   MRN: [id number]  Chief Complaint  Patient presents with   Acute Visit    C/po having a rash/blisters on RT side neck/arm/chest x 3 days.  Has been using hydrocortisone cream and using ice packs.      History of Present Illness:  Patient is in today with an outbreak Wed. of a rash on her right neck/shoulder/upper chest. She notes there have been small bumps that developed. She has noted considerable itching. She is also having pain in this area. Cheryl Yu notes she did have shingles some years ago. She has never received the shingles vaccine due to fears about side effects from this. She does have a 35 month-old great-granddaughter and wonders about exposing her.  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/20/2021   Degenerative arthritis of hip- Right 09/20/2021   Degenerative arthritis of left shoulder region 09/20/2021   Osteoporosis without current pathological fracture 12/07/2020   Gastroesophageal reflux disease without esophagitis 12/07/2020   Generalized anxiety disorder 12/07/2020   Past Surgical History:  Procedure Laterality Date   ABDOMINAL HYSTERECTOMY     Took out Uterus   BREAST ENHANCEMENT SURGERY  1990   silicone   CHOLECYSTECTOMY  1977   Open   complete bladder reconstruction     TRABECULECTOMY     Family History  Problem Relation Age of Onset   Arthritis Mother    Cancer Mother        Ovarian   Hearing loss Mother    Stroke Mother    COPD Father    Cancer Sister        Pancreatic   Early death Sister    Kidney disease Sister    Depression Daughter    Alcohol abuse Son    Depression Son    Diabetes Son    Arthritis Maternal  Grandmother    Heart disease Maternal Grandmother    Cancer Maternal Grandfather    Early death Maternal Grandfather    Outpatient Medications Prior to Visit  Medication Sig Dispense Refill   ASPIRIN 81 PO Take by mouth.     buPROPion (WELLBUTRIN SR) 150 MG 12 hr tablet Take 1 tablet (150 mg total) by mouth daily for 7 days, THEN 1 tablet (150 mg total) 2 (two) times daily 21 tablet 0   cholecalciferol (VITAMIN D3) 25 MCG (1000 UNIT) tablet Take 1,000 Units by mouth daily.     famotidine (PEPCID) 20 MG tablet Take 1 tablet (20 mg total) by mouth 2 (two) times daily. 60 tablet 11   Iron, Ferrous Sulfate, 325 (65 Fe) MG TABS Take 325 mg by mouth daily. 30 tablet 5   Multiple Vitamin (MULTIVITAMIN) tablet Take 1 tablet by mouth daily.     rOPINIRole (REQUIP) 1 MG tablet Take 1 tablet (1 mg total) by mouth at bedtime. 30 tablet 3   VENTOLIN HFA 108 (90 Base) MCG/ACT inhaler TAKE 2 PUFFS BY MOUTH EVERY 6 HOURS AS NEEDED FOR WHEEZE OR SHORTNESS OF BREATH 18 each 1   No facility-administered medications prior to visit.   Allergies  Allergen Reactions   Codeine Other (See Comments)   Morphine Other (See Comments)     Objective:   Today's  Vitals   01/20/22 0758  BP: 122/80  Pulse: 89  Temp: (!) 96.5 F (35.8 C)  TempSrc: Temporal  SpO2: 98%  Weight: 172 lb 12.8 oz (78.4 kg)  Height: 5\' 3"  (1.6 m)   Body mass index is 30.61 kg/m.   General: Well developed, well nourished. No acute distress. Skin: There are multiple small vesicles in clusters, some of which have crusted, in a zone overlying the   right neck, up to the chin/jawline, the shoulder and down onto the chest.  Neuro: CN II-XII intact. Normal sensation and DTR bilaterally. Psych: Alert and oriented. Normal mood and affect.  Health Maintenance Due  Topic Date Due   DTaP/Tdap/Td (1 - Tdap) Never done   Zoster Vaccines- Shingrix (1 of 2) Never done   Pneumonia Vaccine 69+ Years old (1 - PCV) Never done     Assessment &  Plan:   1. Herpes zoster without complication Cheryl Yu is having an acute outbreak of herpes zoster. I recommend we start her on valacyclovir. I will provide some tramadol for pain management. I do recommend she avoid being around her infant great-granddaughter until she stops getting new lesions and all of these are crusted. Follow-up if pain is not managing well.  - valACYclovir (VALTREX) 1000 MG tablet; Take 1 tablet (1,000 mg total) by mouth 3 (three) times daily for 7 days.  Dispense: 21 tablet; Refill: 0 - traMADol (ULTRAM) 50 MG tablet; Take 1 tablet (50 mg total) by mouth every 8 (eight) hours as needed for up to 5 days.  Dispense: 15 tablet; Refill: 0   Return if symptoms worsen or fail to improve.   Roseanne Reno, MD

## 2022-01-20 NOTE — Patient Instructions (Signed)

## 2022-01-21 ENCOUNTER — Emergency Department (HOSPITAL_COMMUNITY)
Admission: EM | Admit: 2022-01-21 | Discharge: 2022-01-22 | Disposition: A | Discharge status: Home / Self Care | Payer: Medicare Other | Attending: Emergency Medicine | Admitting: Emergency Medicine

## 2022-01-21 ENCOUNTER — Encounter (HOSPITAL_COMMUNITY): Payer: Self-pay | Admitting: Emergency Medicine

## 2022-01-21 DIAGNOSIS — Z7982 Long term (current) use of aspirin: Secondary | ICD-10-CM | POA: Diagnosis not present

## 2022-01-21 DIAGNOSIS — B029 Zoster without complications: Secondary | ICD-10-CM | POA: Diagnosis present

## 2022-01-21 LAB — BASIC METABOLIC PANEL
Anion gap: 10 (ref 5–15)
BUN: 11 mg/dL (ref 8–23)
CO2: 25 mmol/L (ref 22–32)
Calcium: 9.9 mg/dL (ref 8.9–10.3)
Chloride: 101 mmol/L (ref 98–111)
Creatinine, Ser: 0.92 mg/dL (ref 0.44–1.00)
GFR, Estimated: >60 mL/min (ref >60)
Glucose, Bld: 158 mg/dL — ABNORMAL HIGH (ref 70–99)
Potassium: 4.4 mmol/L (ref 3.5–5.1)
Sodium: 136 mmol/L (ref 135–145)

## 2022-01-21 LAB — CBC WITH DIFFERENTIAL/PLATELET
Abs Immature Granulocytes: 0.01 10*3/uL (ref 0.00–0.07)
Basophils Absolute: 0 10*3/uL (ref 0.0–0.1)
Basophils Relative: 1 %
Eosinophils Absolute: 0.1 10*3/uL (ref 0.0–0.5)
Eosinophils Relative: 2 %
HCT: 45.2 % (ref 36.0–46.0)
Hemoglobin: 14.6 g/dL (ref 12.0–15.0)
Immature Granulocytes: 0 %
Lymphocytes Relative: 22 %
Lymphs Abs: 1.1 10*3/uL (ref 0.7–4.0)
MCH: 29.9 pg (ref 26.0–34.0)
MCHC: 32.3 g/dL (ref 30.0–36.0)
MCV: 92.4 fL (ref 80.0–100.0)
Monocytes Absolute: 1 10*3/uL (ref 0.1–1.0)
Monocytes Relative: 20 %
Neutro Abs: 2.7 10*3/uL (ref 1.7–7.7)
Neutrophils Relative %: 55 %
Platelets: 174 10*3/uL (ref 150–400)
RBC: 4.89 MIL/uL (ref 3.87–5.11)
RDW: 13 % (ref 11.5–15.5)
WBC: 4.9 10*3/uL (ref 4.0–10.5)
nRBC: 0 % (ref 0.0–0.2)

## 2022-01-21 NOTE — ED Triage Notes (Addendum)
PT dx with shingles on thurs. She called office bc was told to call due  to shingles moving up to ear. She is taking meds for it. She has no paralysis or hearing problems at this time.

## 2022-01-21 NOTE — ED Provider Triage Note (Signed)
Emergency Medicine Provider Triage Evaluation Note  Cheryl Yu , a 76 y.o. female  was evaluated in triage.  Pt complains of rash to right arm, right shoulder, right chest, right face. Noticed 3 days ago. Saw doctor 2 days ago and was started on Valtrex and Tramadol for treatment of shingles. One flare of shingles before that was much milder. Today she noticed lesions appearing on right ear. No facial paralysis or paresthesias, no hearing loss, no vision changes. No fever at home. Sent to ED at insurance nurse request due to concern of Ramsey Hunt Syndrome.   Review of Systems  Positive: See HPI Negative: See HPI  Physical Exam  BP (!) 163/91 (BP Location: Right Arm)   Pulse (!) 123   Temp 98.3 F (36.8 C)   Resp 18   SpO2 100%  Gen:   Awake, no distress   Resp:  Normal effort  MSK:   Moves extremities without difficulty  Other:  Alert and oriented x 4, CNI, normal sensation to face, moderate erythematous vesicular rash to right chest, breast, face, and arm with lesions in various stages, no evidence of overlying bacterial infection such as drainage from lesions, 2 small lesions to right ear lobe, no lesions to ear canal or TM, no intra-oral lesions, no lesions to nose, no obvious lesions around eyes or injection, drainage to eyes or pain with EOM  Medical Decision Making  Medically screening exam initiated at 8:28 PM.  Appropriate orders placed.  Cheryl Yu was informed that the remainder of the evaluation will be completed by another provider, this initial triage assessment does not replace that evaluation, and the importance of remaining in the ED until their evaluation is complete.     Cheryl Yu 01/21/22 2032

## 2022-01-22 MED ORDER — OXYCODONE-ACETAMINOPHEN 5-325 MG PO TABS: 2.0000 | ORAL_TABLET | ORAL | 0 refills | Status: DC | PRN

## 2022-01-22 MED ORDER — PREDNISONE 20 MG PO TABS: ORAL_TABLET | ORAL | 0 refills | Status: DC

## 2022-01-22 MED ORDER — METHYLPREDNISOLONE SODIUM SUCC 125 MG IJ SOLR
125.0000 mg | Freq: Once | INTRAMUSCULAR | Status: AC
  Administered 2022-01-22: 125 mg via INTRAMUSCULAR
  Filled 2022-01-22: qty 2

## 2022-01-22 MED ORDER — OXYCODONE HCL 5 MG PO TABS
5.0000 mg | ORAL_TABLET | Freq: Once | ORAL | Status: AC
  Administered 2022-01-22: 5 mg via ORAL
  Filled 2022-01-22: qty 1

## 2022-01-22 NOTE — ED Notes (Signed)
This RN reviewed discharge instructions with patient. She verbalized understanding and denied any further questions. PT well appearing upon discharge and reports tolerable pain. Pt ambulated with stable gait to exit. Pt endorses ride home.  

## 2022-01-22 NOTE — ED Notes (Signed)
Ice pack given to patient.

## 2022-01-22 NOTE — ED Notes (Signed)
Patient tearful requesting pain meds. Triage RN messaged via secure chat.

## 2022-01-22 NOTE — ED Provider Notes (Signed)
[facility] EMERGENCY DEPARTMENT Provider Note   CSN: [id number] Arrival date & time: 01/21/22  1930     History  Chief Complaint  Patient presents with   Herpes Zoster    Cheryl Yu is a 76 y.o. female.  76 year old female who presents the ER with worsening shingles.  Patient states she was diagnosed with a doctor by shingles a while ago and started tramadol and Valtrex.  Has progressively worsened.  She is managed somewhat with cold packs at home however not really helping much.  She states that they told her if reactive exterior ear that she needed to be seen in the hospital and it has crept up in the front of her right and behind her left ear.  No changes in her hearing.        Home Medications Prior to Admission medications   Medication Sig Start Date End Date Taking? Authorizing Provider  oxyCODONE-acetaminophen (PERCOCET) 5-325 MG tablet Take 2 tablets by mouth every 4 (four) hours as needed. 01/22/22  Yes Rorey Bisson, Barbara Cower, MD  predniSONE (DELTASONE) 20 MG tablet 3 tabs po daily x 3 days, then 2 tabs x 3 days, then 1.5 tabs x 3 days, then 1 tab x 3 days, then 0.5 tabs x 3 days 01/22/22  Yes Michaelann Gunnoe, Barbara Cower, MD  ASPIRIN 81 PO Take by mouth.    [provider]  buPROPion (WELLBUTRIN SR) 150 MG 12 hr tablet Take 1 tablet (150 mg total) by mouth daily for 7 days, THEN 1 tablet (150 mg total) 2 (two) times daily 01/11/22   Loyola Mast, MD  cholecalciferol (VITAMIN D3) 25 MCG (1000 UNIT) tablet Take 1,000 Units by mouth daily.    [provider]  famotidine (PEPCID) 20 MG tablet Take 1 tablet (20 mg total) by mouth 2 (two) times daily. 01/11/22   Loyola Mast, MD  Iron, Ferrous Sulfate, 325 (65 Fe) MG TABS Take 325 mg by mouth daily. 11/01/21   Loyola Mast, MD  Multiple Vitamin (MULTIVITAMIN) tablet Take 1 tablet by mouth daily.    [provider]  rOPINIRole (REQUIP) 1 MG tablet Take 1 tablet (1 mg total) by mouth at bedtime.  12/06/21   Loyola Mast, MD  valACYclovir (VALTREX) 1000 MG tablet Take 1 tablet (1,000 mg total) by mouth 3 (three) times daily for 7 days. 01/20/22 01/27/22  Loyola Mast, MD  VENTOLIN HFA 108 [phone] Base) MCG/ACT inhaler TAKE 2 PUFFS BY MOUTH EVERY 6 HOURS AS NEEDED FOR WHEEZE OR SHORTNESS OF BREATH 11/10/21   Loyola Mast, MD      Allergies    Codeine and Morphine    Review of Systems   Review of Systems  Physical Exam Updated Vital Signs BP (!) 144/87 (BP Location: Left Arm)   Pulse 89   Temp 98.3 F (36.8 C) (Oral)   Resp 15   SpO2 97%  Physical Exam Vitals and nursing note reviewed.  Constitutional:      Appearance: She is well-developed.  HENT:     Head: Normocephalic and atraumatic.     Comments: No obvious external auditory canal lesions, tympanic membrane looks good.  No lesions close to the [facility]. Eyes:     Pupils: Pupils are equal, round, and reactive to light.  Cardiovascular:     Rate and Rhythm: Normal rate and regular rhythm.  Pulmonary:     Effort: No respiratory distress.     Breath sounds: No stridor.  Abdominal:  General: There is no distension.  Musculoskeletal:     Cervical back: Normal range of motion.  Skin:    General: Skin is warm and dry.     Findings: Rash (Diffuse erythematous rash with multiple vesicles right upper chest, shoulder upper back neck and in the front behind her right ear.) present.  Neurological:     General: No focal deficit present.     Mental Status: She is alert.     ED Results / Procedures / Treatments   Labs (all labs ordered are listed, but only abnormal results are displayed) Labs Reviewed  BASIC METABOLIC PANEL - Abnormal; Notable for the following components:      Result Value   Glucose, Bld 158 (*)    All other components within normal limits  CBC WITH DIFFERENTIAL/PLATELET    EKG None  Radiology No results found.  Procedures Procedures    Medications Ordered in ED Medications   methylPREDNISolone sodium succinate (SOLU-MEDROL) 125 mg/2 mL injection 125 mg (has no administration in time range)  oxyCODONE (Oxy IR/ROXICODONE) immediate release tablet 5 mg (has no administration in time range)    ED Course/ Medical Decision Making/ A&P                           Medical Decision Making Risk Prescription drug management.   No evidence of complications from the zoster however does appear to be progressing.  Significantly red skin by suspect this more related to the virus and cellulitis.   Will start steroids to help slow the progression hopefully.  Also will go ahead and up her from tramadol to oxycodone since she is on bupropion and ropinirole at home and I would hate for there to be a reaction for small amount of pain benefit.  Will follow-up with doctor if not improving in about a week.   Final Clinical Impression(s) / ED Diagnoses Final diagnoses:  Herpes zoster without complication    Rx / DC Orders ED Discharge Orders          Ordered    predniSONE (DELTASONE) 20 MG tablet        01/22/22 0852    oxyCODONE-acetaminophen (PERCOCET) 5-325 MG tablet  Every 4 hours PRN        01/22/22 0852              Venessa Wickham, Barbara Cower, MD 01/22/22 [phone]

## 2022-01-24 ENCOUNTER — Telehealth: Payer: Self-pay

## 2022-01-24 NOTE — Patient Outreach (Signed)
  Care Coordination [facility] Note Transition Care Management Unsuccessful Follow-up Telephone Call  Date of discharge and from where:  [facility] 01/22/23  Attempts:  1st Attempt  Reason for unsuccessful TCM follow-up call:  No answer/busy  Jodelle Gross, RN, BSN, CCM Care Management Coordinator [facility] Phone: [phone]/Fax: [phone]

## 2022-01-25 ENCOUNTER — Telehealth: Payer: Self-pay

## 2022-01-25 NOTE — Patient Outreach (Signed)
  Care Coordination TOC Note Transition Care Management Follow-up Telephone Call Date of discharge and from where: [facility] 01/21/22 How have you been since you were released from the hospital? "I am a little nauseated but the rash is scabbing over.  I am trying not to itch it". Any questions or concerns? No  Items Reviewed: Did the pt receive and understand the discharge instructions provided? Yes  Medications obtained and verified? Yes  Other? No  Any new allergies since your discharge? No  Dietary orders reviewed? No Do you have support at home? Yes   Home Care and Equipment/Supplies: Were home health services ordered? no If so, what is the name of the agency? N/A  Has the agency set up a time to come to the patient's home? not applicable Were any new equipment or medical supplies ordered?  No What is the name of the medical supply agency? N/a Were you able to get the supplies/equipment? not applicable Do you have any questions related to the use of the equipment or supplies? No  Functional Questionnaire: (I = Independent and D = Dependent) ADLs: I  Bathing/Dressing- I  Meal Prep- I  Eating- I  Maintaining continence- I  Transferring/Ambulation- I  Managing Meds- I  Follow up appointments reviewed:  PCP Hospital f/u appt confirmed? No   Specialist Hospital f/u appt confirmed? No   Are transportation arrangements needed? No  If their condition worsens, is the pt aware to call PCP or go to the Emergency Dept.? Yes Was the patient provided with contact information for the PCP's office or ED? Yes Was to pt encouraged to call back with questions or concerns? Yes  SDOH assessments and interventions completed:   Yes SDOH Interventions Today    Flowsheet Row Most Recent Value  SDOH Interventions   Food Insecurity Interventions Intervention Not Indicated  Transportation Interventions Intervention Not Indicated  Utilities Interventions Intervention Not Indicated        Care Coordination Interventions:  PCP follow up appointment requested   Encounter Outcome:  Pt. Visit Completed

## 2022-02-01 ENCOUNTER — Other Ambulatory Visit: Payer: Self-pay | Admitting: Family Medicine

## 2022-02-01 ENCOUNTER — Ambulatory Visit (INDEPENDENT_AMBULATORY_CARE_PROVIDER_SITE_OTHER): Payer: Medicare Other | Admitting: Family Medicine

## 2022-02-01 ENCOUNTER — Encounter: Payer: Self-pay | Admitting: Family Medicine

## 2022-02-01 VITALS — BP 160/88 | HR 75 | Temp 97.4°F | Ht 63.0 in | Wt 172.2 lb

## 2022-02-01 DIAGNOSIS — F411 Generalized anxiety disorder: Secondary | ICD-10-CM

## 2022-02-01 DIAGNOSIS — B029 Zoster without complications: Secondary | ICD-10-CM | POA: Diagnosis not present

## 2022-02-01 MED ORDER — HYDROXYZINE PAMOATE 25 MG PO CAPS: 25.0000 mg | ORAL_CAPSULE | Freq: Three times a day (TID) | ORAL | 0 refills | Status: DC | PRN

## 2022-02-01 NOTE — Progress Notes (Signed)
[facility] [address] [place] [address] Dept: [phone] Dept Fax: [phone]  Office Visit  Subjective:    Patient ID: Cheryl Yu, female    DOB: 1945-12-08, 77 y.o..   MRN: [id number]  Chief Complaint  Patient presents with   Hospitalization [facility] f/u from shingles.  Still having pain/itching on her chest.     History of Present Illness:  Patient is in today for reassessment of her shingles. I saw her on 12/22 with a 48 hour outbreak of a rash on her right neck/shoulder/upper chest consistent with acute herpes zoster. I initiated valacyclovir and provided tramadol for pain management. Cheryl Yu was seen the next day at [facility] with worse pain and continued spread of the rash. She was given a course of prednisone and  provided Percocet for pain. Cheryl Yu notes that her rash is starting to improve. She continues to have pain associated with this. At times, she feels jolts of pain over her right upper chest. She finds patting this area after this starts does seem to help. She also notes itching to be a problem.  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/20/2021   Degenerative arthritis of hip- Right 09/20/2021   Degenerative arthritis of left shoulder region 09/20/2021   Osteoporosis without current pathological fracture 12/07/2020   Gastroesophageal reflux disease without esophagitis 12/07/2020   Generalized anxiety disorder 12/07/2020   Past Surgical History:  Procedure Laterality Date   ABDOMINAL HYSTERECTOMY     Took out Uterus   BREAST ENHANCEMENT SURGERY  8527   silicone   CHOLECYSTECTOMY  1977   Open   complete bladder reconstruction     TRABECULECTOMY     Family History  Problem Relation Age of Onset   Arthritis Mother    Cancer Mother        Ovarian   Hearing loss Mother    Stroke Mother    COPD  Father    Cancer Sister        Pancreatic   Early death Sister    Kidney disease Sister    Depression Daughter    Alcohol abuse Son    Depression Son    Diabetes Son    Arthritis Maternal Grandmother    Heart disease Maternal Grandmother    Cancer Maternal Grandfather    Early death Maternal Grandfather    Outpatient Medications Prior to Visit  Medication Sig Dispense Refill   ASPIRIN 81 PO Take by mouth.     buPROPion (WELLBUTRIN SR) 150 MG 12 hr tablet Take 1 tablet (150 mg total) by mouth daily for 7 days, THEN 1 tablet (150 mg total) 2 (two) times daily 21 tablet 0   cholecalciferol (VITAMIN D3) 25 MCG (1000 UNIT) tablet Take 1,000 Units by mouth daily.     famotidine (PEPCID) 20 MG tablet Take 1 tablet (20 mg total) by mouth 2 (two) times daily. 60 tablet 11   Iron, Ferrous Sulfate, 325 (65 Fe) MG TABS Take 325 mg by mouth daily. 30 tablet 5   Multiple Vitamin (MULTIVITAMIN) tablet Take 1 tablet by mouth daily.     rOPINIRole (REQUIP) 1 MG tablet Take 1 tablet (1 mg total) by mouth at bedtime. 30 tablet 3   VENTOLIN HFA 108 (90 Base) MCG/ACT inhaler TAKE 2 PUFFS BY MOUTH EVERY 6 HOURS AS NEEDED FOR WHEEZE OR SHORTNESS OF BREATH 18  each 1   oxyCODONE-acetaminophen (PERCOCET) 5-325 MG tablet Take 2 tablets by mouth every 4 (four) hours as needed. (Patient not taking: Reported on 02/01/2022) 20 tablet 0   predniSONE (DELTASONE) 20 MG tablet 3 tabs po daily x 3 days, then 2 tabs x 3 days, then 1.5 tabs x 3 days, then 1 tab x 3 days, then 0.5 tabs x 3 days 27 tablet 0   No facility-administered medications prior to visit.   Allergies  Allergen Reactions   Codeine Other (See Comments)   Morphine Other (See Comments)     Objective:   Today's Vitals   02/01/22 1003  BP: (!) 160/88  Pulse: 75  Temp: (!) 97.4 F (36.3 C)  TempSrc: Temporal  SpO2: 98%  Weight: 172 lb 3.2 oz (78.1 kg)  Height: 5\' 3"  (1.6 m)   Body mass index is 30.5 kg/m.   General: Well developed, well  nourished. No acute distress. Skin: Warm and dry. Multiple crusted lesions in a zone overlying the right neck, up to the chin/jawline, the   shoulder and down onto the chest. Psych: Alert and oriented. Normal mood and affect.  Health Maintenance Due  Topic Date Due   DTaP/Tdap/Td (1 - Tdap) Never done   Zoster Vaccines- Shingrix (1 of 2) Never done   Pneumonia Vaccine 24+ Years old (1 - PCV) Never done     Assessment & Plan:   1. 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 Acute pain associated with herpes zoster  Cheryl Yu is starting to resolve her shingles outbreak. I recommend she use a moisturizing lotion on this area. She should be safe at this point to be around her minor grandchildren. We discussed ongoing pain management. She is still within the time when this is most likely zoster-associated pain. I will have her continue to use Percocet if needed. I willa dd hydroxyzine for itching and to see if it will improve sleep. She is scheduled to see me on 1/22. I will have her keep that appointment to determine if she has any post-herpetic neuralgia that needs to be addressed.  - hydrOXYzine (VISTARIL) 25 MG capsule; Take 1 capsule (25 mg total) by mouth every 8 (eight) hours as needed.  Dispense: 30 capsule; Refill: 0  Return for As scheduled.   Haydee Salter, MD

## 2022-02-08 ENCOUNTER — Telehealth: Payer: Self-pay | Admitting: Family Medicine

## 2022-02-08 NOTE — Telephone Encounter (Signed)
Spoke to patient, she did pass out yesterday and declined to go to the hospital with the EMS.  She states she feels okay just a little fatigued and has been taking it easy today. Her daughter is with her and if she doesn't feel any better in the next 1-2 days she will call us to make an appointment to come in to be evaluated.   Dm/cma

## 2022-02-08 NOTE — Telephone Encounter (Signed)
Caller Name: Jacqlyn Marolf Call back phone #: [phone]  Reason for Call: Pt's daughter had to call 911 last night (1/09) around 7:15. Pt had gotten up thinking she needed to throw up but instead passed out. Ambulance came and measured vitals.  Bp 117/76, HR 80, RR 16, SPO2 98ra, Cbt 148 Pt was advised to go to hospital last night but declined.

## 2022-02-20 ENCOUNTER — Telehealth: Payer: Self-pay | Admitting: Family Medicine

## 2022-02-20 ENCOUNTER — Encounter: Payer: Self-pay | Admitting: Family Medicine

## 2022-02-20 ENCOUNTER — Ambulatory Visit (INDEPENDENT_AMBULATORY_CARE_PROVIDER_SITE_OTHER): Payer: Medicare Other | Admitting: Family Medicine

## 2022-02-20 VITALS — BP 128/70 | HR 81 | Temp 96.9°F | Ht 63.0 in | Wt 171.0 lb

## 2022-02-20 DIAGNOSIS — B029 Zoster without complications: Secondary | ICD-10-CM

## 2022-02-20 DIAGNOSIS — R55 Syncope and collapse: Secondary | ICD-10-CM | POA: Diagnosis not present

## 2022-02-20 MED ORDER — LIDOCAINE 5 % EX OINT: 1.0000 | TOPICAL_OINTMENT | Freq: Four times a day (QID) | CUTANEOUS | 2 refills | Status: DC | PRN

## 2022-02-20 MED ORDER — GABAPENTIN 300 MG PO CAPS: ORAL_CAPSULE | ORAL | 3 refills | Status: DC

## 2022-02-20 NOTE — Telephone Encounter (Signed)
Caller Name: pt Call back phone #: [phone]  Reason for Call: When she got to pharmacy to pick up her lidocaine she was told her ins did not cover. She asked if there is something else that would be?

## 2022-02-20 NOTE — Progress Notes (Signed)
[facility] [address] [place] [address] Dept: [phone] Dept Fax: [phone]  Office Visit  Subjective:    Patient ID: Cheryl Yu, female    DOB: [birth year], 77 y.o..   MRN: [id number]  Chief Complaint  Patient presents with   Follow-up    Pain with Shingles, Patient fell on 02-07-22 and has some concerns    History of Present Illness:  Patient is in today for reassessment of her shingles. I saw her on 12/22 with a 48 hour outbreak of a rash on her right neck/shoulder/upper chest consistent with acute herpes zoster. I initiated valacyclovir and provided tramadol for pain management. Cheryl Yu was seen the next day at [facility] with worse pain and continued spread of the rash. She was given a course of prednisone and provided Percocet for pain. I followed up with her on 02/01/2022. We added hydroxyzine for itching.   Cheryl Yu notes she is having significant "pins and needles" pain in the area impacted by her shingles. She is hesitant to take too much of her Percocet and doesn't like the way this makes her feel drowsy all the time. She is noting that she has increased pain with even her clothing touching this area.  Cheryl Yu did have an episode of syncope on 02/07/2022. She ntoes she did loose her bowel control at the time of passing out. EMS came to the house. She was evaluated and recommended for transport. However, she did not want to leave home having defecated on herself, so did nto go in. She is feelign better now.  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/20/2021   Degenerative arthritis of hip- Right 09/20/2021   Degenerative arthritis of left shoulder region 09/20/2021   Osteoporosis without current pathological fracture 12/07/2020   Gastroesophageal reflux disease without esophagitis 12/07/2020   Generalized anxiety disorder  12/07/2020   Past Surgical History:  Procedure Laterality Date   ABDOMINAL HYSTERECTOMY     Took out Uterus   BREAST ENHANCEMENT SURGERY  0867   silicone   CHOLECYSTECTOMY  1977   Open   complete bladder reconstruction     TRABECULECTOMY     Family History  Problem Relation Age of Onset   Arthritis Mother    Cancer Mother        Ovarian   Hearing loss Mother    Stroke Mother    COPD Father    Cancer Sister        Pancreatic   Early death Sister    Kidney disease Sister    Depression Daughter    Alcohol abuse Son    Depression Son    Diabetes Son    Arthritis Maternal Grandmother    Heart disease Maternal Grandmother    Cancer Maternal Grandfather    Early death Maternal Grandfather    Outpatient Medications Prior to Visit  Medication Sig Dispense Refill   ASPIRIN 81 PO Take by mouth.     buPROPion (WELLBUTRIN SR) 150 MG 12 hr tablet Take 1 tablet (150 mg total) by mouth 2 (two) times daily. TAKE 1 TABLET (150 MG TOTAL) BY MOUTH DAILY FOR 7 DAYS, THEN 1 TABLET (150 MG TOTAL) 2 TIMES DAILY 180 tablet 3   famotidine (PEPCID) 20 MG tablet Take 1 tablet (20 mg total) by mouth 2 (two) times daily. 60 tablet 11   hydrOXYzine (VISTARIL) 25 MG capsule Take 1 capsule (  25 mg total) by mouth every 8 (eight) hours as needed. 30 capsule 0   Multiple Vitamin (MULTIVITAMIN) tablet Take 1 tablet by mouth daily.     oxyCODONE-acetaminophen (PERCOCET) 5-325 MG tablet Take 2 tablets by mouth every 4 (four) hours as needed. 20 tablet 0   rOPINIRole (REQUIP) 1 MG tablet Take 1 tablet (1 mg total) by mouth at bedtime. 30 tablet 3   VENTOLIN HFA 108 (90 Base) MCG/ACT inhaler TAKE 2 PUFFS BY MOUTH EVERY 6 HOURS AS NEEDED FOR WHEEZE OR SHORTNESS OF BREATH 18 each 1   cholecalciferol (VITAMIN D3) 25 MCG (1000 UNIT) tablet Take 1,000 Units by mouth daily. (Patient not taking: Reported on 02/20/2022)     Iron, Ferrous Sulfate, 325 (65 Fe) MG TABS Take 325 mg by mouth daily. (Patient not taking:  Reported on 02/20/2022) 30 tablet 5   No facility-administered medications prior to visit.   Allergies  Allergen Reactions   Codeine Other (See Comments)   Morphine Other (See Comments)   Objective:   Today's Vitals   02/20/22 1000  BP: 128/70  Pulse: 81  Temp: (!) 96.9 F (36.1 C)  TempSrc: Tympanic  SpO2: 96%  Weight: 171 lb (77.6 kg)  Height: 5\' 3"  (1.6 m)   Body mass index is 30.29 kg/m.   General: Well developed, well nourished. No acute distress. Skin: Resolving rash over the right neck, shoulder, and upper chest. Psych: Alert and oriented. Normal mood and affect.  Health Maintenance Due  Topic Date Due   DTaP/Tdap/Td (1 - Tdap) Never done   Zoster Vaccines- Shingrix (1 of 2) Never done   Pneumonia Vaccine 40+ Years old (1 - PCV) Never done   COVID-19 Vaccine (3 - 2023-24 season) 09/30/2021   Medicare Annual Wellness (AWV)  04/20/2022     Assessment & Plan:   1. Zoster associated pain Cheryl Yu continues to struggle with pain associated with her shingles outbreak. I will try starting her on gabapentin and provide some topical lidocaine to see if we can get her more comfortable.  - gabapentin (NEURONTIN) 300 MG capsule; Take 1 capsule (300 mg total) by mouth at bedtime for 2 days, THEN 1 capsule (300 mg total) 2 (two) times daily for 2 days, THEN 1 capsule (300 mg total) 3 (three) times daily  Dispense: 90 capsule; Refill: 3 - lidocaine (XYLOCAINE) 5 % ointment; Apply 1 Application topically 4 (four) times daily as needed.  Dispense: 35.44 g; Refill: 2  2. Syncope and collapse I reviewed a 12-lead EKG that the EMS ran. This was normal. Her vitals at that time were also normal. This episode of syncope was likely related to her recent illness. She should stay hydrated and we will observe this for now.  Return in about 2 weeks (around 03/06/2022) for Reassessment.   Haydee Salter, MD

## 2022-02-21 ENCOUNTER — Other Ambulatory Visit (HOSPITAL_COMMUNITY): Payer: Self-pay

## 2022-02-21 ENCOUNTER — Telehealth: Payer: Self-pay

## 2022-02-21 ENCOUNTER — Other Ambulatory Visit: Payer: Self-pay | Admitting: Family Medicine

## 2022-02-21 ENCOUNTER — Telehealth: Payer: Self-pay | Admitting: Family Medicine

## 2022-02-21 DIAGNOSIS — B029 Zoster without complications: Secondary | ICD-10-CM

## 2022-02-21 NOTE — Telephone Encounter (Signed)
Pt is calling again to say pharmacy said if Dr Gena Fray states why she needs the lidocaine they will cover.

## 2022-02-21 NOTE — Telephone Encounter (Signed)
Pharmacy Patient Advocate Encounter   Received notification from [facility] that prior authorization for Lidocaine 5% oint is required/requested.  Per Test Claim: Prior auth required   PA submitted on 02/21/22 to (ins) [employer] [place] Medicare Part D via CoverMyMeds [facility] Status is pending

## 2022-02-22 NOTE — Telephone Encounter (Signed)
Approved eff 02/21/2022-02/22/2023 Fax will be sent in Per [place] w/[employer]

## 2022-02-22 NOTE — Telephone Encounter (Signed)
Pharmacy notified VIA phone that RX was approved and will get ready for patient.  Patient notified [place] phone.  Dm/cma

## 2022-03-06 ENCOUNTER — Ambulatory Visit (INDEPENDENT_AMBULATORY_CARE_PROVIDER_SITE_OTHER): Payer: Medicare Other | Admitting: Family Medicine

## 2022-03-06 ENCOUNTER — Encounter: Payer: Self-pay | Admitting: Family Medicine

## 2022-03-06 VITALS — BP 132/74 | HR 65 | Temp 96.7°F | Ht 63.0 in | Wt 175.4 lb

## 2022-03-06 DIAGNOSIS — B029 Zoster without complications: Secondary | ICD-10-CM

## 2022-03-06 NOTE — Progress Notes (Signed)
[facility] [address] [place] [address] Dept: [phone] Dept Fax: [phone]  Office Visit  Subjective:    Patient ID: Cheryl Yu, female    DOB: Mar 28, 1945, 77 y.o..   MRN: [id number]  Chief Complaint  Patient presents with   Follow-up    2 week f/u.       History of Present Illness:  Patient is in today for reassessment of her shingles. I saw her on 12/22 with a 48-hour outbreak of a rash on her right neck/shoulder/upper chest consistent with acute herpes zoster. I initiated valacyclovir and provided tramadol for pain management. Ms. Mazor was seen the next day at [facility] with worse pain and continued spread of the rash. She was given a course of prednisone and provided Percocet for pain. I followed up with her on 02/01/2022. We added hydroxyzine for itching. At her last visit (02/20/2022), she was having significant "pins and needles" pain in the area impacted by her shingles and allodynia. I started her on gabapentin and provided lidocaine ointment for topical use.  Today, she notes the rash is resolving. He pain is more manageable. She finds the lidocaine reduces the discomfort, but does not fully numb this. He daughter notes her mood is improved. Ms. Quintela is no longer needing to take her pain medicine.  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/20/2021   Degenerative arthritis of hip- Right 09/20/2021   Degenerative arthritis of left shoulder region 09/20/2021   Osteoporosis without current pathological fracture 12/07/2020   Gastroesophageal reflux disease without esophagitis 12/07/2020   Generalized anxiety disorder 12/07/2020   Past Surgical History:  Procedure Laterality Date   ABDOMINAL HYSTERECTOMY     Took out Uterus   BREAST ENHANCEMENT SURGERY  1031   silicone   CHOLECYSTECTOMY  1977   Open   complete bladder  reconstruction     TRABECULECTOMY     Family History  Problem Relation Age of Onset   Arthritis Mother    Cancer Mother        Ovarian   Hearing loss Mother    Stroke Mother    COPD Father    Cancer Sister        Pancreatic   Early death Sister    Kidney disease Sister    Depression Daughter    Alcohol abuse Son    Depression Son    Diabetes Son    Arthritis Maternal Grandmother    Heart disease Maternal Grandmother    Cancer Maternal Grandfather    Early death Maternal Grandfather    Outpatient Medications Prior to Visit  Medication Sig Dispense Refill   ASPIRIN 81 PO Take by mouth.     buPROPion (WELLBUTRIN SR) 150 MG 12 hr tablet Take 1 tablet (150 mg total) by mouth 2 (two) times daily. TAKE 1 TABLET (150 MG TOTAL) BY MOUTH DAILY FOR 7 DAYS, THEN 1 TABLET (150 MG TOTAL) 2 TIMES DAILY 180 tablet 3   cholecalciferol (VITAMIN D3) 25 MCG (1000 UNIT) tablet Take 1,000 Units by mouth daily.     famotidine (PEPCID) 20 MG tablet Take 1 tablet (20 mg total) by mouth 2 (two) times daily. 60 tablet 11   gabapentin (NEURONTIN) 300 MG capsule Take 1 capsule (300 mg total) by mouth at bedtime for 2 days, THEN 1 capsule (300 mg total) 2 (two) times daily for 2 days, THEN 1 capsule (300  mg total) 3 (three) times daily 90 capsule 3   hydrOXYzine (VISTARIL) 25 MG capsule Take 1 capsule (25 mg total) by mouth every 8 (eight) hours as needed. 30 capsule 0   Iron, Ferrous Sulfate, 325 (65 Fe) MG TABS Take 325 mg by mouth daily. 30 tablet 5   lidocaine (XYLOCAINE) 5 % ointment Apply 1 Application topically 4 (four) times daily as needed. 35.44 g 2   Multiple Vitamin (MULTIVITAMIN) tablet Take 1 tablet by mouth daily.     oxyCODONE-acetaminophen (PERCOCET) 5-325 MG tablet Take 2 tablets by mouth every 4 (four) hours as needed. 20 tablet 0   rOPINIRole (REQUIP) 1 MG tablet Take 1 tablet (1 mg total) by mouth at bedtime. 30 tablet 3   VENTOLIN HFA 108 (90 Base) MCG/ACT inhaler TAKE 2 PUFFS BY MOUTH  EVERY 6 HOURS AS NEEDED FOR WHEEZE OR SHORTNESS OF BREATH 18 each 1   No facility-administered medications prior to visit.   Allergies  Allergen Reactions   Codeine Other (See Comments)   Morphine Other (See Comments)    Objective:   Today's Vitals   03/06/22 1021  BP: 132/74  Pulse: 65  Temp: (!) 96.7 F (35.9 C)  TempSrc: Temporal  SpO2: (!) 75%  Weight: 175 lb 6.4 oz (79.6 kg)  Height: 5\' 3"  (1.6 m)   Body mass index is 31.07 kg/m.   General: Well developed, well nourished. No acute distress. Skin: Warm and dry. Rash on upper chest and shoulder is resolving well, with just mild erythema still present. Psych: Alert and oriented. Normal mood and affect.  Health Maintenance Due  Topic Date Due   DTaP/Tdap/Td (1 - Tdap) Never done   Zoster Vaccines- Shingrix (1 of 2) Never done   Pneumonia Vaccine 65+ Years old (1 - PCV) Never done   Medicare Annual Wellness (AWV)  04/20/2022     Assessment & Plan:   Problem List Items Addressed This Visit   None Visit Diagnoses     Acute pain associated with herpes zoster    -  Primary   Continue gabapentin 300 mg TID and lidocaine 5% ointment as needed. I will reassess in 1 montha nd hopefully taper her off of the gabapentin.       Return in about 4 weeks (around 04/03/2022) for Reassessment.   Haydee Salter, MD

## 2022-04-01 ENCOUNTER — Other Ambulatory Visit: Payer: Self-pay | Admitting: Family Medicine

## 2022-04-01 DIAGNOSIS — G2581 Restless legs syndrome: Secondary | ICD-10-CM

## 2022-05-01 ENCOUNTER — Ambulatory Visit (INDEPENDENT_AMBULATORY_CARE_PROVIDER_SITE_OTHER): Payer: Medicare Other

## 2022-05-01 VITALS — Ht 63.0 in | Wt 170.0 lb

## 2022-05-01 DIAGNOSIS — Z0000 Encounter for general adult medical examination without abnormal findings: Secondary | ICD-10-CM

## 2022-05-01 NOTE — Patient Instructions (Signed)
[name] , Thank you for taking time to come for your Medicare Wellness Visit. I appreciate your ongoing commitment to your health goals. Please review the following plan we discussed and let me know if I can assist you in the future.   These are the goals we discussed:  Goals      Increase physical activity     Patient Stated     Eat healthier & drink more water     Patient Stated     05/01/2022, wants to live to be 89        This is a list of the screening recommended for you and due dates:  Health Maintenance  Topic Date Due   DTaP/Tdap/Td vaccine (1 - Tdap) Never done   Zoster (Shingles) Vaccine (1 of 2) Never done   Pneumonia Vaccine (1 of 1 - PCV) Never done   COVID-19 Vaccine (3 - 2023-24 season) 09/30/2021   Flu Shot  08/31/2022   Medicare Annual Wellness Visit  05/01/2023   DEXA scan (bone density measurement)  Completed   Hepatitis C Screening: USPSTF Recommendation to screen - Ages 18-79 yo.  Completed   HPV Vaccine  Aged Out    Advanced directives: Advance directive discussed with you today.   Conditions/risks identified: none  Next appointment: Follow up in one year for your annual wellness visit    Preventive Care 65 Years and Older, Female Preventive care refers to lifestyle choices and visits with your health care provider that can promote health and wellness. What does preventive care include? A yearly physical exam. This is also called an annual well check. Dental exams once or twice a year. Routine eye exams. Ask your health care provider how often you should have your eyes checked. Personal lifestyle choices, including: Daily care of your teeth and gums. Regular physical activity. Eating a healthy diet. Avoiding tobacco and drug use. Limiting alcohol use. Practicing safe sex. Taking low-dose aspirin every day. Taking vitamin and mineral supplements as recommended by your health care provider. What happens during an annual well check? The  services and screenings done by your health care provider during your annual well check will depend on your age, overall health, lifestyle risk factors, and family history of disease. Counseling  Your health care provider may ask you questions about your: Alcohol use. Tobacco use. Drug use. Emotional well-being. Home and relationship well-being. Sexual activity. Eating habits. History of falls. Memory and ability to understand (cognition). Work and work Statistician. Reproductive health. Screening  You may have the following tests or measurements: Height, weight, and BMI. Blood pressure. Lipid and cholesterol levels. These may be checked every 5 years, or more frequently if you are over 30 years old. Skin check. Lung cancer screening. You may have this screening every year starting at age 60 if you have a 30-pack-year history of smoking and currently smoke or have quit within the past 15 years. Fecal occult blood test (FOBT) of the stool. You may have this test every year starting at age 16. Flexible sigmoidoscopy or colonoscopy. You may have a sigmoidoscopy every 5 years or a colonoscopy every 10 years starting at age 41. Hepatitis C blood test. Hepatitis B blood test. Sexually transmitted disease (STD) testing. Diabetes screening. This is done by checking your blood sugar (glucose) after you have not eaten for a while (fasting). You may have this done every 1-3 years. Bone density scan. This is done to screen for osteoporosis. You may have this done starting  at age 31. Mammogram. This may be done every 1-2 years. Talk to your health care provider about how often you should have regular mammograms. Talk with your health care provider about your test results, treatment options, and if necessary, the need for more tests. Vaccines  Your health care provider may recommend certain vaccines, such as: Influenza vaccine. This is recommended every year. Tetanus, diphtheria, and acellular  pertussis (Tdap, Td) vaccine. You may need a Td booster every 10 years. Zoster vaccine. You may need this after age 63. Pneumococcal 13-valent conjugate (PCV13) vaccine. One dose is recommended after age 74. Pneumococcal polysaccharide (PPSV23) vaccine. One dose is recommended after age 41. Talk to your health care provider about which screenings and vaccines you need and how often you need them. This information is not intended to replace advice given to you by your health care provider. Make sure you discuss any questions you have with your health care provider. Document Released: 02/12/2015 Document Revised: 10/06/2015 Document Reviewed: 11/17/2014 Elsevier Interactive Patient Education  2017 [place] Prevention in the Home Falls can cause injuries. They can happen to people of all ages. There are many things you can do to make your home safe and to help prevent falls. What can I do on the outside of my home? Regularly fix the edges of walkways and driveways and fix any cracks. Remove anything that might make you trip as you walk through a door, such as a raised step or threshold. Trim any bushes or trees on the path to your home. Use bright outdoor lighting. Clear any walking paths of anything that might make someone trip, such as rocks or tools. Regularly check to see if handrails are loose or broken. Make sure that both sides of any steps have handrails. Any raised decks and porches should have guardrails on the edges. Have any leaves, snow, or ice cleared regularly. Use sand or salt on walking paths during winter. Clean up any spills in your garage right away. This includes oil or grease spills. What can I do in the bathroom? Use night lights. Install grab bars by the toilet and in the tub and shower. Do not use towel bars as grab bars. Use non-skid mats or decals in the tub or shower. If you need to sit down in the shower, use a plastic, non-slip stool. Keep the floor  dry. Clean up any water that spills on the floor as soon as it happens. Remove soap buildup in the tub or shower regularly. Attach bath mats securely with double-sided non-slip rug tape. Do not have throw rugs and other things on the floor that can make you trip. What can I do in the bedroom? Use night lights. Make sure that you have a light by your bed that is easy to reach. Do not use any sheets or blankets that are too big for your bed. They should not hang down onto the floor. Have a firm chair that has side arms. You can use this for support while you get dressed. Do not have throw rugs and other things on the floor that can make you trip. What can I do in the kitchen? Clean up any spills right away. Avoid walking on wet floors. Keep items that you use a lot in easy-to-reach places. If you need to reach something above you, use a strong step stool that has a grab bar. Keep electrical cords out of the way. Do not use floor polish or wax that makes  floors slippery. If you must use wax, use non-skid floor wax. Do not have throw rugs and other things on the floor that can make you trip. What can I do with my stairs? Do not leave any items on the stairs. Make sure that there are handrails on both sides of the stairs and use them. Fix handrails that are broken or loose. Make sure that handrails are as long as the stairways. Check any carpeting to make sure that it is firmly attached to the stairs. Fix any carpet that is loose or worn. Avoid having throw rugs at the top or bottom of the stairs. If you do have throw rugs, attach them to the floor with carpet tape. Make sure that you have a light switch at the top of the stairs and the bottom of the stairs. If you do not have them, ask someone to add them for you. What else can I do to help prevent falls? Wear shoes that: Do not have high heels. Have rubber bottoms. Are comfortable and fit you well. Are closed at the toe. Do not wear  sandals. If you use a stepladder: Make sure that it is fully opened. Do not climb a closed stepladder. Make sure that both sides of the stepladder are locked into place. Ask someone to hold it for you, if possible. Clearly mark and make sure that you can see: Any grab bars or handrails. First and last steps. Where the edge of each step is. Use tools that help you move around (mobility aids) if they are needed. These include: Canes. Walkers. Scooters. Crutches. Turn on the lights when you go into a dark area. Replace any light bulbs as soon as they burn out. Set up your furniture so you have a clear path. Avoid moving your furniture around. If any of your floors are uneven, fix them. If there are any pets around you, be aware of where they are. Review your medicines with your doctor. Some medicines can make you feel dizzy. This can increase your chance of falling. Ask your doctor what other things that you can do to help prevent falls. This information is not intended to replace advice given to you by your health care provider. Make sure you discuss any questions you have with your health care provider. Document Released: 11/12/2008 Document Revised: 06/24/2015 Document Reviewed: 02/20/2014 Elsevier Interactive Patient Education  2017 [employer].

## 2022-05-01 NOTE — Progress Notes (Signed)
I connected with  Elgie Collard on 05/01/22 by a audio enabled telemedicine application and verified that I am speaking with the correct person using two identifiers.  Patient Location: Home  Provider Location: Office/Clinic  I discussed the limitations of evaluation and management by telemedicine. The patient expressed understanding and agreed to proceed.  Subjective:   Cheryl Yu is a 77 y.o. female who presents for Medicare Annual (Subsequent) preventive examination.  Patient Medicare AWV questionnaire was completed by the patient on 04/30/2022; I have confirmed that all information answered by patient is correct and no changes since this date.     Review of Systems     Cardiac Risk Factors include: advanced age (>27men, >47 women);obesity (BMI >30kg/m2)     Objective:    Today's Vitals   05/01/22 0841 05/01/22 0842  Weight: 170 lb (77.1 kg)   Height: 5\' 3"  (1.6 m)   PainSc:  4    Body mass index is 30.11 kg/m.     05/01/2022    8:50 AM 04/19/2021    9:07 AM 03/23/2020    8:23 AM 03/19/2019    2:01 PM  Advanced Directives  Does Patient Have a Medical Advance Directive? No No Yes No  Does patient want to make changes to medical advance directive?   Yes (MAU/Ambulatory/Procedural Areas - Information given)   Would patient like information on creating a medical advance directive?  No - Patient declined  No - Patient declined    Current Medications (verified) Outpatient Encounter Medications as of 05/01/2022  Medication Sig   ASPIRIN 81 PO Take by mouth.   buPROPion (WELLBUTRIN SR) 150 MG 12 hr tablet Take 1 tablet (150 mg total) by mouth 2 (two) times daily. TAKE 1 TABLET (150 MG TOTAL) BY MOUTH DAILY FOR 7 DAYS, THEN 1 TABLET (150 MG TOTAL) 2 TIMES DAILY   cholecalciferol (VITAMIN D3) 25 MCG (1000 UNIT) tablet Take 1,000 Units by mouth daily.   famotidine (PEPCID) 20 MG tablet Take 1 tablet (20 mg total) by mouth 2 (two) times daily.   gabapentin (NEURONTIN) 300 MG  capsule Take 1 capsule (300 mg total) by mouth at bedtime for 2 days, THEN 1 capsule (300 mg total) 2 (two) times daily for 2 days, THEN 1 capsule (300 mg total) 3 (three) times daily   hydrOXYzine (VISTARIL) 25 MG capsule Take 1 capsule (25 mg total) by mouth every 8 (eight) hours as needed.   Multiple Vitamin (MULTIVITAMIN) tablet Take 1 tablet by mouth daily.   rOPINIRole (REQUIP) 1 MG tablet TAKE 1 TABLET BY MOUTH AT BEDTIME.   VENTOLIN HFA 108 (90 Base) MCG/ACT inhaler TAKE 2 PUFFS BY MOUTH EVERY 6 HOURS AS NEEDED FOR WHEEZE OR SHORTNESS OF BREATH   Iron, Ferrous Sulfate, 325 (65 Fe) MG TABS Take 325 mg by mouth daily.   lidocaine (XYLOCAINE) 5 % ointment Apply 1 Application topically 4 (four) times daily as needed.   oxyCODONE-acetaminophen (PERCOCET) 5-325 MG tablet Take 2 tablets by mouth every 4 (four) hours as needed. (Patient not taking: Reported on 05/01/2022)   No facility-administered encounter medications on file as of 05/01/2022.    Allergies (verified) Codeine and Morphine   History: Past Medical History:  Diagnosis Date   Allergy    Arthritis    Depression    GERD (gastroesophageal reflux disease)    Syncope    Past Surgical History:  Procedure Laterality Date   ABDOMINAL HYSTERECTOMY     Took out Uterus   BREAST ENHANCEMENT  SURGERY  0000000   silicone   CHOLECYSTECTOMY  1977   Open   complete bladder reconstruction     TRABECULECTOMY     Family History  Problem Relation Age of Onset   Arthritis Mother    Cancer Mother        Ovarian   Hearing loss Mother    Stroke Mother    COPD Father    Cancer Sister        Pancreatic   Early death Sister    Kidney disease Sister    Depression Daughter    Alcohol abuse Son    Depression Son    Diabetes Son    Arthritis Maternal Grandmother    Heart disease Maternal Grandmother    Cancer Maternal Grandfather    Early death Maternal Grandfather    Social History   Socioeconomic History   Marital status: Divorced     Spouse name: Not on file   Number of children: 3   Years of education: Not on file   Highest education level: Some college, no degree  Occupational History   Occupation: Retired  Tobacco Use   Smoking status: Former    Packs/day: .5    Types: Cigarettes    Quit date: 2000    Years since quitting: 24.2   Smokeless tobacco: Never  Vaping Use   Vaping Use: Never used  Substance and Sexual Activity   Alcohol use: Yes    Comment: 1 glass wine daily   Drug use: Never   Sexual activity: Yes  Other Topics Concern   Not on file  Social History Narrative   Not on file   Social Determinants of Health   Financial Resource Strain: Low Risk  (05/01/2022)   Overall Financial Resource Strain (CARDIA)    Difficulty of Paying Living Expenses: Not hard at all  Food Insecurity: No Food Insecurity (04/30/2022)   Hunger Vital Sign    Worried About Running Out of Food in the Last Year: Never true    Ran Out of Food in the Last Year: Never true  Transportation Needs: No Transportation Needs (04/30/2022)   PRAPARE - Hydrologist (Medical): No    Lack of Transportation (Non-Medical): No  Physical Activity: Patient Declined (04/30/2022)   Exercise Vital Sign    Days of Exercise per Week: Patient declined    Minutes of Exercise per Session: Patient declined  Stress: No Stress Concern Present (04/30/2022)   [place]    Feeling of Stress : Only a little  Social Connections: Unknown (04/30/2022)   Social Connection and Isolation Panel [NHANES]    Frequency of Communication with Friends and Family: Once a week    Frequency of Social Gatherings with Friends and Family: Patient declined    Attends Religious Services: Not on Advertising copywriter or Organizations: Patient declined    Attends Archivist Meetings: Never    Marital Status: Divorced    Tobacco Counseling Counseling given:  Not Answered   Clinical Intake:  Pre-visit preparation completed: Yes  Pain : 0-10 Pain Score: 4  Pain Type: Chronic pain Pain Location: Hip Pain Orientation: Right Pain Descriptors / Indicators: Aching Pain Onset: More than a month ago Pain Frequency: Intermittent     Nutritional Status: BMI > 30  Obese Nutritional Risks: None Diabetes: No  How often do you need to have someone help you when you read  instructions, pamphlets, or other written materials from your doctor or pharmacy?: 1 - Never  Diabetic? no  Interpreter Needed?: No  Information entered by :: NAllen LPN   Activities of Daily Living    04/30/2022    1:11 PM  In your present state of health, do you have any difficulty performing the following activities:  Hearing? 0  Vision? 1  Comment does not focus as well  Difficulty concentrating or making decisions? 1  Comment trouble with memory  Walking or climbing stairs? 1  Comment due to hip  Dressing or bathing? 0  Doing errands, shopping? 0  Preparing Food and eating ? N  Using the Toilet? N  In the past six months, have you accidently leaked urine? N  Do you have problems with loss of bowel control? N  Managing your Medications? N  Managing your Finances? N  Housekeeping or managing your Housekeeping? N    Patient Care Team: Haydee Salter, MD as PCP - General (Family Medicine)  Indicate any recent Medical Services you may have received from other than [facility] providers in the past year (date may be approximate).     Assessment:   This is a routine wellness examination for [place].  Hearing/Vision screen Vision Screening - Comments:: No regular eye exams  Dietary issues and exercise activities discussed: Current Exercise Habits: The patient does not participate in regular exercise at present   Goals Addressed             This Visit's Progress    Patient Stated       05/01/2022, wants to live to be 89       Depression Screen     05/01/2022    8:51 AM 02/20/2022   10:04 AM 04/19/2021    9:09 AM 04/19/2021    9:06 AM 12/07/2020   12:03 PM 03/23/2020    8:29 AM 03/19/2019    2:04 PM  PHQ 2/9 Scores  PHQ - 2 Score 0 1 0 0 4 0 0  PHQ- 9 Score     19      Fall Risk    04/30/2022    1:11 PM 02/20/2022   10:04 AM 04/19/2021    9:08 AM 03/23/2020    8:26 AM 03/19/2019    2:04 PM  Fall Risk   Falls in the past year? 0 1 0 0 0  Number falls in past yr: 0 0 0 0 0  Injury with Fall? 0 0 0 0 0  Risk for fall due to : Medication side effect History of fall(s) No Fall Risks    Follow up Falls evaluation completed;Education provided;Falls prevention discussed Falls evaluation completed Falls evaluation completed Falls prevention discussed Education provided;Falls prevention discussed    FALL RISK PREVENTION PERTAINING TO THE HOME:  Any stairs in or around the home? Yes  If so, are there any without handrails? No  Home free of loose throw rugs in walkways, pet beds, electrical cords, etc? Yes  Adequate lighting in your home to reduce risk of falls? Yes   ASSISTIVE DEVICES UTILIZED TO PREVENT FALLS:  Life alert? No  Use of a cane, walker or w/c? No  Grab bars in the bathroom? No  Shower chair or bench in shower? No  Elevated toilet seat or a handicapped toilet? No   TIMED UP AND GO:  Was the test performed? No .      Cognitive Function:        05/01/2022  8:54 AM  6CIT Screen  What Year? 0 points  What month? 0 points  What time? 0 points  Count back from 20 0 points  Months in reverse 0 points  Repeat phrase 0 points  Total Score 0 points    Immunizations Immunization History  Administered Date(s) Administered   Fluad Quad(high Dose 65+) 10/25/2018, 11/20/2019, 12/07/2020   Influenza-Unspecified 10/30/2021   PFIZER(Purple Top)SARS-COV-2 Vaccination 06/07/2019, 06/26/2019    TDAP status: Due, Education has been provided regarding the importance of this vaccine. Advised may receive this vaccine at  local pharmacy or Health Dept. Aware to provide a copy of the vaccination record if obtained from local pharmacy or Health Dept. Verbalized acceptance and understanding.  Flu Vaccine status: Up to date  Pneumococcal vaccine status: Due, Education has been provided regarding the importance of this vaccine. Advised may receive this vaccine at local pharmacy or Health Dept. Aware to provide a copy of the vaccination record if obtained from local pharmacy or Health Dept. Verbalized acceptance and understanding.  Covid-19 vaccine status: Completed vaccines  Qualifies for Shingles Vaccine? Yes   Zostavax completed No   Shingrix Completed?: No.    Education has been provided regarding the importance of this vaccine. Patient has been advised to call insurance company to determine out of pocket expense if they have not yet received this vaccine. Advised may also receive vaccine at local pharmacy or Health Dept. Verbalized acceptance and understanding.  Screening Tests Health Maintenance  Topic Date Due   DTaP/Tdap/Td (1 - Tdap) Never done   Zoster Vaccines- Shingrix (1 of 2) Never done   Pneumonia Vaccine 23+ Years old (1 of 1 - PCV) Never done   COVID-19 Vaccine (3 - 2023-24 season) 09/30/2021   Medicare Annual Wellness (AWV)  04/20/2022   INFLUENZA VACCINE  08/31/2022   DEXA SCAN  Completed   Hepatitis C Screening  Completed   HPV VACCINES  Aged Out    Health Maintenance  Health Maintenance Due  Topic Date Due   DTaP/Tdap/Td (1 - Tdap) Never done   Zoster Vaccines- Shingrix (1 of 2) Never done   Pneumonia Vaccine 80+ Years old (1 of 1 - PCV) Never done   COVID-19 Vaccine (3 - 2023-24 season) 09/30/2021   Medicare Annual Wellness (AWV)  04/20/2022    Colorectal cancer screening: No longer required.   Mammogram status: Completed 08/01/2021. Repeat every year  Bone Density status: Completed 06/22/2021.   Lung Cancer Screening: (Low Dose CT Chest recommended if Age 68-80 years, 30  pack-year currently smoking OR have quit w/in 15years.) does not qualify.   Lung Cancer Screening Referral: no  Additional Screening:  Hepatitis C Screening: does qualify; Completed 09/20/2021  Vision Screening: Recommended annual ophthalmology exams for early detection of glaucoma and other disorders of the eye. Is the patient up to date with their annual eye exam?  No  Who is the provider or what is the name of the office in which the patient attends annual eye exams? none If pt is not established with a provider, would they like to be referred to a provider to establish care? No .   Dental Screening: Recommended annual dental exams for proper oral hygiene  Community Resource Referral / Chronic Care Management: CRR required this visit?  No   CCM required this visit?  No      Plan:     I have personally reviewed and noted the following in the patient's chart:   Medical and social history  Use of alcohol, tobacco or illicit drugs  Current medications and supplements including opioid prescriptions. Patient is not currently taking opioid prescriptions. Functional ability and status Nutritional status Physical activity Advanced directives List of other physicians Hospitalizations, surgeries, and ER visits in previous 12 months Vitals Screenings to include cognitive, depression, and falls Referrals and appointments  In addition, I have reviewed and discussed with patient certain preventive protocols, quality metrics, and best practice recommendations. A written personalized care plan for preventive services as well as general preventive health recommendations were provided to patient.     Kellie Simmering, LPN   [id number]   Nurse Notes: none  Due to this being a virtual visit, the after visit summary with patients personalized plan was offered to patient via mail or my-chart. Patient would like to access on my-chart/

## 2022-05-04 NOTE — Telephone Encounter (Signed)
error 

## 2022-05-08 ENCOUNTER — Ambulatory Visit (INDEPENDENT_AMBULATORY_CARE_PROVIDER_SITE_OTHER): Payer: Medicare Other | Admitting: Family Medicine

## 2022-05-08 ENCOUNTER — Encounter: Payer: Self-pay | Admitting: Family Medicine

## 2022-05-08 VITALS — Temp 97.3°F | Ht 63.0 in | Wt 174.0 lb

## 2022-05-08 DIAGNOSIS — F411 Generalized anxiety disorder: Secondary | ICD-10-CM | POA: Diagnosis not present

## 2022-05-08 DIAGNOSIS — Z23 Encounter for immunization: Secondary | ICD-10-CM

## 2022-05-08 DIAGNOSIS — G2581 Restless legs syndrome: Secondary | ICD-10-CM

## 2022-05-08 DIAGNOSIS — B0229 Other postherpetic nervous system involvement: Secondary | ICD-10-CM | POA: Diagnosis not present

## 2022-05-08 MED ORDER — BUPROPION HCL ER (SR) 200 MG PO TB12: 200.0000 mg | ORAL_TABLET | Freq: Two times a day (BID) | ORAL | 3 refills | Status: DC

## 2022-05-08 MED ORDER — ROPINIROLE HCL 2 MG PO TABS: 2.0000 mg | ORAL_TABLET | Freq: Every day | ORAL | 11 refills | Status: DC

## 2022-05-08 NOTE — Assessment & Plan Note (Signed)
Continue gabapentin 300 mg TID 

## 2022-05-08 NOTE — Assessment & Plan Note (Signed)
I will try increasing her bupropion SR to 200 mg bid.

## 2022-05-08 NOTE — Progress Notes (Signed)
[facility] [address] [place] [address] Dept: [phone] Dept Fax: [phone]  Chronic Care Office Visit  Subjective:    Patient ID: Cheryl Yu, female    DOB: 20-Dec-1945, 77 y.o..   MRN: [id number]  Chief Complaint  Patient presents with   Medical Management of Chronic Issues    4 week f/u.  No concerns.      History of Present Illness:  Patient is in today for reassessment of chronic medical issues.  Cheryl Yu has a history of shingles, which developed around 01/18/2022. This involved her right neck/shoulder/upper chest area. She was treated with a course of valacyclovir and provided tramadol for pain management. She was seen soon after at [facility] with worse pain and continued spread of the rash. She was given a course of prednisone and provided Percocet for pain. By 02/20/2022, she was having significant "pins and needles" pain in the area impacted by her shingles and allodynia. I started her on gabapentin and provided lidocaine ointment for topical use. At this point, she still has some allodynia and has to be careful about where clothes that are too tight.  She continues on gabapentin.  Cheryl Yu has a history of restless legs. She is now managed on ropinirole 1 mg at bedtime. She feels this is not as effective as it once was. She is no longer taking an iron supplement.   Cheryl Yu has a history of generalized anxiety with some depressive symptoms. She is currently managed bupropion SR 150 mg twice daily. Her mood has not been as well managed as she would like.   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   Osteoporosis without current pathological fracture 12/07/2020    Gastroesophageal reflux disease without esophagitis 12/07/2020   Generalized anxiety disorder 12/07/2020   Past Surgical History:  Procedure Laterality Date   ABDOMINAL HYSTERECTOMY     Took out Uterus   BREAST ENHANCEMENT SURGERY  1990   silicone   CHOLECYSTECTOMY  1977   Open   complete bladder reconstruction     TRABECULECTOMY     Family History  Problem Relation Age of Onset   Arthritis Mother    Cancer Mother        Ovarian   Hearing loss Mother    Stroke Mother    COPD Father    Cancer Sister        Pancreatic   Early death Sister    Kidney disease Sister    Depression Daughter    Alcohol abuse Son    Depression Son    Diabetes Son    Arthritis Maternal Grandmother    Heart disease Maternal Grandmother    Cancer Maternal Grandfather    Early death Maternal Grandfather    Outpatient Medications Prior to Visit  Medication Sig Dispense Refill   ASPIRIN 81 PO Take by mouth.     cholecalciferol (VITAMIN D3) 25 MCG (1000 UNIT) tablet Take 1,000 Units by mouth daily.     famotidine (PEPCID) 20 MG tablet Take 1 tablet (20 mg total) by mouth 2 (two) times daily. 60 tablet 11   gabapentin (NEURONTIN) 300 MG capsule Take 1 capsule (300 mg total) by mouth at bedtime for 2 days, THEN 1 capsule (300 mg total) 2 (two)  times daily for 2 days, THEN 1 capsule (300 mg total) 3 (three) times daily 90 capsule 3   hydrOXYzine (VISTARIL) 25 MG capsule Take 1 capsule (25 mg total) by mouth every 8 (eight) hours as needed. 30 capsule 0   Multiple Vitamin (MULTIVITAMIN) tablet Take 1 tablet by mouth daily.     oxyCODONE-acetaminophen (PERCOCET) 5-325 MG tablet Take 2 tablets by mouth every 4 (four) hours as needed. 20 tablet 0   VENTOLIN HFA 108 (90 Base) MCG/ACT inhaler TAKE 2 PUFFS BY MOUTH EVERY 6 HOURS AS NEEDED FOR WHEEZE OR SHORTNESS OF BREATH 18 each 1   buPROPion (WELLBUTRIN SR) 150 MG 12 hr tablet Take 1 tablet (150 mg total) by mouth 2 (two) times daily. TAKE 1 TABLET (150 MG  TOTAL) BY MOUTH DAILY FOR 7 DAYS, THEN 1 TABLET (150 MG TOTAL) 2 TIMES DAILY 180 tablet 3   rOPINIRole (REQUIP) 1 MG tablet TAKE 1 TABLET BY MOUTH AT BEDTIME. 30 tablet 3   Iron, Ferrous Sulfate, 325 (65 Fe) MG TABS Take 325 mg by mouth daily. 30 tablet 5   lidocaine (XYLOCAINE) 5 % ointment Apply 1 Application topically 4 (four) times daily as needed. 35.44 g 2   No facility-administered medications prior to visit.   Allergies  Allergen Reactions   Codeine Other (See Comments)   Morphine Other (See Comments)   Objective:   Today's Vitals   05/08/22 1005  Temp: (!) 97.3 F (36.3 C)  TempSrc: Temporal  Weight: 174 lb (78.9 kg)  Height: 5\' 3"  (1.6 m)   Body mass index is 30.82 kg/m.   General: Well developed, well nourished. No acute distress. Skin: Warm and dry. Shingles rash has resolved. Psych: Alert and oriented. Normal mood and affect.  Health Maintenance Due  Topic Date Due   Zoster Vaccines- Shingrix (1 of 2) Never done   Pneumonia Vaccine 82+ Years old (1 of 1 - PCV) Never done    Assessment & Plan:   Problem List Items Addressed This Visit       Nervous and Auditory   Postherpetic neuralgia - Primary    Continue gabapentin 300 mg TID.        Other   Generalized anxiety disorder    I will try increasing her bupropion SR to 200 mg bid.      Relevant Medications   buPROPion (WELLBUTRIN SR) 200 MG 12 hr tablet   Restless legs    I will try increasing her ropinorole to 2 mg at bedtime. If not improving, we may restart her iron pill      Relevant Medications   rOPINIRole (REQUIP) 2 MG tablet   Other Visit Diagnoses     Need for pneumococcal 20-valent conjugate vaccination       Relevant Orders   Pneumococcal conjugate vaccine 20-valent (Prevnar 20) (Completed)      Return in about 3 months (around 08/07/2022) for Reassessment.   Loyola Mast, MD

## 2022-05-08 NOTE — Assessment & Plan Note (Signed)
I will try increasing her ropinorole to 2 mg at bedtime. If not improving, we may restart her iron pill

## 2022-06-16 ENCOUNTER — Other Ambulatory Visit: Payer: Self-pay | Admitting: Family Medicine

## 2022-06-16 DIAGNOSIS — B029 Zoster without complications: Secondary | ICD-10-CM

## 2022-08-07 ENCOUNTER — Ambulatory Visit (INDEPENDENT_AMBULATORY_CARE_PROVIDER_SITE_OTHER): Payer: Medicare Other | Admitting: Family Medicine

## 2022-08-07 VITALS — BP 118/76 | HR 70 | Temp 97.5°F | Ht 63.0 in | Wt 171.8 lb

## 2022-08-07 DIAGNOSIS — F411 Generalized anxiety disorder: Secondary | ICD-10-CM

## 2022-08-07 DIAGNOSIS — M1611 Unilateral primary osteoarthritis, right hip: Secondary | ICD-10-CM

## 2022-08-07 DIAGNOSIS — R413 Other amnesia: Secondary | ICD-10-CM | POA: Diagnosis not present

## 2022-08-07 DIAGNOSIS — B0229 Other postherpetic nervous system involvement: Secondary | ICD-10-CM

## 2022-08-07 MED ORDER — CITALOPRAM HYDROBROMIDE 20 MG PO TABS: ORAL_TABLET | ORAL | 3 refills | Status: DC

## 2022-08-07 NOTE — Patient Instructions (Addendum)
Decrease gabapentin 300 mg to one (1) capsule twice a day for 1 week, then one (1) capsule daily for a week, then stop. Decrease your bupropion 200 mg to one (1) tablet daily for 3 days, then stop. Wait 24 hours after stopping bupropion, before starting citalopram.

## 2022-08-07 NOTE — Assessment & Plan Note (Signed)
Resolving. I will have Ms. Hollett taper and stop her gabapentin.

## 2022-08-07 NOTE — Progress Notes (Signed)
[facility]ARE [facility] [address] [place] [address] Dept: [phone] Dept Fax: [phone]  Chronic Care Office Visit  Subjective:    Patient ID: Cheryl Yu, female    DOB: 03-Oct-1945, 77 y.o..   MRN: [id number]  Chief Complaint  Patient presents with   Medical Management of Chronic Issues    3 month f/u, welbutrin not working for her and anxiety is up.    History of Present Illness:  Patient is in today for reassessment of chronic medical issues.  Cheryl Yu has a history of generalized anxiety with some depressive symptoms. She is currently managed bupropion SR 150 mg twice daily. We switched her to bupropion from citalopram last Fall, as she was hoping it would help with weight loss. However, she and her daughter both feel her mood is worse on the current regimen. She would like to switch back to citalopram.   Cheryl Yu has a history of shingles, which developed around 01/18/2022. She has been on gabapentin for PHN. She notes she has only a small area in the upper mid chest that remains a little painful. She would like to stop her gabapentin.   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 She notes it has become progressively harder for her to ambulate for long distances, However, she is not interested in surgical options at this point.  She does ask about a handicap parking placard.  Cheryl Yu daughter raises concerns about her mother's memory. They had recently moved to a new apartment. She notes that her mother repeatedly asked about the contents of a box, not recognizing that she had already asked. She has not necessarily had issues with other cognitive issues.  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   Osteoporosis without current pathological fracture 12/07/2020   Gastroesophageal reflux disease without esophagitis 12/07/2020   Generalized anxiety disorder 12/07/2020   Past Surgical History:  Procedure Laterality Date   ABDOMINAL HYSTERECTOMY     Took out Uterus   BREAST ENHANCEMENT SURGERY  1990   silicone   CHOLECYSTECTOMY  1977   Open   complete bladder reconstruction     TRABECULECTOMY     Family History  Problem Relation Age of Onset   Arthritis Mother    Cancer Mother        Ovarian   Hearing loss Mother    Stroke Mother    COPD Father    Cancer Sister        Pancreatic   Early death Sister    Kidney disease Sister    Depression Daughter    Alcohol abuse Son    Depression Son    Diabetes Son    Arthritis Maternal Grandmother    Heart disease Maternal Grandmother    Cancer Maternal Grandfather    Early death Maternal Grandfather    Outpatient Medications Prior to Visit  Medication Sig Dispense Refill   ASPIRIN 81 PO Take by mouth.     buPROPion (WELLBUTRIN SR) 200 MG 12 hr tablet Take 1 tablet (200 mg total) by mouth 2 (two) times daily. 180 tablet 3   cholecalciferol (VITAMIN D3) 25 MCG (1000 UNIT) tablet Take 1,000 Units by mouth daily.  famotidine (PEPCID) 20 MG tablet Take 1 tablet (20 mg total) by mouth 2 (two) times daily. 60 tablet 11   gabapentin (NEURONTIN) 300 MG capsule Take 1 capsule (300 mg total) by mouth 3 (three) times daily. TAKE 1 CAPSULE BY MOUTH AT BEDTIME FOR 2 DAYS, THEN 1 CAP 2 TIMES DAILY FOR 2 DAYS,THEN 1 CAP 3 TIMES DAILY 90 capsule 3   Multiple Vitamin (MULTIVITAMIN) tablet Take 1 tablet by mouth daily.     rOPINIRole (REQUIP) 2 MG tablet Take 1 tablet (2 mg total) by mouth at bedtime. 30 tablet 11   VENTOLIN HFA 108 (90 Base) MCG/ACT inhaler TAKE 2 PUFFS BY MOUTH EVERY 6 HOURS AS NEEDED FOR WHEEZE OR SHORTNESS OF BREATH 18 each 1   hydrOXYzine  (VISTARIL) 25 MG capsule Take 1 capsule (25 mg total) by mouth every 8 (eight) hours as needed. 30 capsule 0   oxyCODONE-acetaminophen (PERCOCET) 5-325 MG tablet Take 2 tablets by mouth every 4 (four) hours as needed. 20 tablet 0   No facility-administered medications prior to visit.   Allergies  Allergen Reactions   Codeine Other (See Comments)   Morphine Other (See Comments)   Objective:   Today's Vitals   08/07/22 1000  BP: 118/76  Pulse: 70  Temp: (!) 97.5 F (36.4 C)  TempSrc: Temporal  SpO2: 100%  Weight: 171 lb 12.8 oz (77.9 kg)  Height: 5\' 3"  (1.6 m)   Body mass index is 30.43 kg/m.   General: Well developed, well nourished. No acute distress. Psych: Alert and oriented. Normal mood and affect.  Health Maintenance Due  Topic Date Due   Zoster Vaccines- Shingrix (1 of 2) Never done      08/07/2022   10:29 AM 05/01/2022    8:51 AM 02/20/2022   10:04 AM  Depression screen PHQ 2/9  Decreased Interest 0 0 1  Down, Depressed, Hopeless 0 0 0  PHQ - 2 Score 0 0 1  Altered sleeping 0    Tired, decreased energy 0    Change in appetite 0    Feeling bad or failure about yourself  0    Trouble concentrating 0    Moving slowly or fidgety/restless 0    Suicidal thoughts 0    PHQ-9 Score 0    Difficult doing work/chores Not difficult at all        08/07/2022   10:29 AM 12/07/2020   12:03 PM  GAD 7 : Generalized Anxiety Score  Nervous, Anxious, on Edge 3 3  Control/stop worrying 3 3  Worry too much - different things 3 3  Trouble relaxing 3 2  Restless 3 2  Easily annoyed or irritable 3 3  Afraid - awful might happen 3 3  Total GAD 7 Score 21 19  Anxiety Difficulty Extremely difficult Somewhat difficult     Assessment & Plan:   Problem List Items Addressed This Visit       Nervous and Auditory   Postherpetic neuralgia    Resolving. I will have Cheryl Yu taper and stop her gabapentin.        Musculoskeletal and Integument   Degenerative arthritis of hip-  Right    Discussed potential surgical options. Cheryl Yu is not ready for this at present. I completed a disabled parking request.        Other   Generalized anxiety disorder - Primary    This is not well managed currently. I will have Cheryl Yu taper and stop her bupropion. I  will plan to restart her citalopram and titrate up on the dose to 20 mg daily.      Relevant Medications   citalopram (CELEXA) 20 MG tablet   Memory change    I reassured MS. Takada and her daughter that this could be normal memory changes with aging. However, there could be overlap with her poor sleep and anxiety. I recommend we address the anxiety issue and then plan a MMSE in the future.       Return in about 2 months (around 10/08/2022).   Loyola Mast, MD

## 2022-08-07 NOTE — Assessment & Plan Note (Signed)
Discussed potential surgical options. ms. Enquist is not ready for this at present. I completed a disabled parking request.

## 2022-08-07 NOTE — Assessment & Plan Note (Signed)
This is not well managed currently. I will have MS. [name] taper and stop her bupropion. I will plan to restart her citalopram and titrate up on the dose to 20 mg daily.

## 2022-08-07 NOTE — Assessment & Plan Note (Signed)
I reassured Cheryl Yu and her daughter that this could be normal memory changes with aging. However, there could be overlap with her poor sleep and anxiety. I recommend we address the anxiety issue and then plan a MMSE in the future.

## 2022-08-16 ENCOUNTER — Encounter (HOSPITAL_COMMUNITY): Payer: Self-pay

## 2022-08-16 ENCOUNTER — Emergency Department (HOSPITAL_COMMUNITY)
Admission: EM | Admit: 2022-08-16 | Discharge: 2022-08-16 | Disposition: A | Discharge status: Home / Self Care | Payer: Medicare Other | Attending: Emergency Medicine | Admitting: Emergency Medicine

## 2022-08-16 ENCOUNTER — Other Ambulatory Visit: Payer: Self-pay

## 2022-08-16 ENCOUNTER — Telehealth: Payer: Self-pay | Admitting: Family Medicine

## 2022-08-16 DIAGNOSIS — R42 Dizziness and giddiness: Secondary | ICD-10-CM | POA: Diagnosis not present

## 2022-08-16 DIAGNOSIS — R5383 Other fatigue: Secondary | ICD-10-CM | POA: Insufficient documentation

## 2022-08-16 DIAGNOSIS — R5381 Other malaise: Secondary | ICD-10-CM | POA: Diagnosis not present

## 2022-08-16 DIAGNOSIS — Z1152 Encounter for screening for COVID-19: Secondary | ICD-10-CM | POA: Insufficient documentation

## 2022-08-16 DIAGNOSIS — R110 Nausea: Secondary | ICD-10-CM | POA: Diagnosis not present

## 2022-08-16 DIAGNOSIS — Z7982 Long term (current) use of aspirin: Secondary | ICD-10-CM | POA: Diagnosis not present

## 2022-08-16 LAB — COMPREHENSIVE METABOLIC PANEL
ALT: 16 U/L (ref 0–44)
AST: 26 U/L (ref 15–41)
Albumin: 3.5 g/dL (ref 3.5–5.0)
Alkaline Phosphatase: 69 U/L (ref 38–126)
Anion gap: 6 (ref 5–15)
BUN: 13 mg/dL (ref 8–23)
CO2: 23 mmol/L (ref 22–32)
Calcium: 8.3 mg/dL — ABNORMAL LOW (ref 8.9–10.3)
Chloride: 107 mmol/L (ref 98–111)
Creatinine, Ser: 0.79 mg/dL (ref 0.44–1.00)
GFR, Estimated: >60 mL/min (ref >60)
Glucose, Bld: 112 mg/dL — ABNORMAL HIGH (ref 70–99)
Potassium: 4.2 mmol/L (ref 3.5–5.1)
Sodium: 136 mmol/L (ref 135–145)
Total Bilirubin: 1.2 mg/dL (ref 0.3–1.2)
Total Protein: 6.1 g/dL — ABNORMAL LOW (ref 6.5–8.1)

## 2022-08-16 LAB — CBC WITH DIFFERENTIAL/PLATELET
Abs Immature Granulocytes: 0.02 10*3/uL (ref 0.00–0.07)
Basophils Absolute: 0 10*3/uL (ref 0.0–0.1)
Basophils Relative: 0 %
Eosinophils Absolute: 0.1 10*3/uL (ref 0.0–0.5)
Eosinophils Relative: 1 %
HCT: 39.4 % (ref 36.0–46.0)
Hemoglobin: 12.9 g/dL (ref 12.0–15.0)
Immature Granulocytes: 0 %
Lymphocytes Relative: 22 %
Lymphs Abs: 1.7 10*3/uL (ref 0.7–4.0)
MCH: 29.9 pg (ref 26.0–34.0)
MCHC: 32.7 g/dL (ref 30.0–36.0)
MCV: 91.2 fL (ref 80.0–100.0)
Monocytes Absolute: 1 10*3/uL (ref 0.1–1.0)
Monocytes Relative: 13 %
Neutro Abs: 4.8 10*3/uL (ref 1.7–7.7)
Neutrophils Relative %: 64 %
Platelets: 230 10*3/uL (ref 150–400)
RBC: 4.32 MIL/uL (ref 3.87–5.11)
RDW: 12.8 % (ref 11.5–15.5)
WBC: 7.6 10*3/uL (ref 4.0–10.5)
nRBC: 0 % (ref 0.0–0.2)

## 2022-08-16 LAB — URINALYSIS, ROUTINE W REFLEX MICROSCOPIC
Bacteria, UA: NONE SEEN
Bilirubin Urine: NEGATIVE
Glucose, UA: NEGATIVE mg/dL
Hgb urine dipstick: NEGATIVE
Ketones, ur: NEGATIVE mg/dL
Nitrite: NEGATIVE
Protein, ur: NEGATIVE mg/dL
Specific Gravity, Urine: 1.013 (ref 1.005–1.030)
pH: 7 (ref 5.0–8.0)

## 2022-08-16 LAB — SARS CORONAVIRUS 2 BY RT PCR: SARS Coronavirus 2 by RT PCR: NEGATIVE

## 2022-08-16 LAB — TROPONIN I (HIGH SENSITIVITY): Troponin I (High Sensitivity): <2 ng/L (ref <18)

## 2022-08-16 LAB — LIPASE, BLOOD: Lipase: 24 U/L (ref 11–51)

## 2022-08-16 MED ORDER — ONDANSETRON 4 MG PO TBDP: 4.0000 mg | ORAL_TABLET | ORAL | 0 refills | Status: DC | PRN

## 2022-08-16 MED ORDER — ONDANSETRON 4 MG PO TBDP
4.0000 mg | ORAL_TABLET | Freq: Once | ORAL | Status: AC
  Administered 2022-08-16: 4 mg via ORAL
  Filled 2022-08-16: qty 1

## 2022-08-16 NOTE — ED Provider Notes (Signed)
[place] EMERGENCY DEPARTMENT AT [facility] Provider Note   CSN: [id number] Arrival date & time: 08/16/22  1815     History  Chief Complaint  Patient presents with   Nausea    Cheryl Yu is a 77 y.o. female.  HPI Patient reports she is felt extremely nauseated for the past day.  She has not vomited.  She has been trying to drink Gatorade and bland foods.  No diarrhea.  She reports she has felt lightheaded with standing.  There is some component of dizziness that is made worse by standing.  Patient denies she had any visual changes, no headache, no chest pain.  She reports that she felt somewhat short of breath today which was what prompted her to call EMS.  She reports she is not sure if it was because she was just generally feeling worse and got anxious where she was actually short of breath.  Patient reports she is essentially felt like she had flu but with no fever or general body aches.  Patient reports she had a recent medication change.  She had been on Celexa for many years and thought it needed to be changed.  She was changed to Wellbutrin and took that for several months but felt that it was not doing anything.  Thus her physician was transitioning her back to Celexa and had her discontinue Wellbutrin and start Celexa back at a lower dose.  She has been taking it for several days now.  She is unsure if the medications are related.  She also has recently stopped Neurontin.  She had post herpetic neuralgia that she was taking that for.    Home Medications Prior to Admission medications   Medication Sig Start Date End Date Taking? Authorizing Provider  ondansetron (ZOFRAN-ODT) 4 MG disintegrating tablet Take 1 tablet (4 mg total) by mouth every 4 (four) hours as needed for nausea or vomiting. 08/16/22  Yes Arby Barrette, MD  ASPIRIN 81 PO Take by mouth.    [provider]  buPROPion (WELLBUTRIN SR) 200 MG 12 hr tablet Take 1 tablet (200 mg total) by mouth 2  (two) times daily. 05/08/22   Loyola Mast, MD  cholecalciferol (VITAMIN D3) 25 MCG (1000 UNIT) tablet Take 1,000 Units by mouth daily.    [provider]  citalopram (CELEXA) 20 MG tablet Take 0.5 tablets (10 mg total) by mouth daily for 8 days, THEN 1 tablet (20 mg total) daily. 08/07/22   Loyola Mast, MD  famotidine (PEPCID) 20 MG tablet Take 1 tablet (20 mg total) by mouth 2 (two) times daily. 01/11/22   Loyola Mast, MD  gabapentin (NEURONTIN) 300 MG capsule Take 1 capsule (300 mg total) by mouth 3 (three) times daily. TAKE 1 CAPSULE BY MOUTH AT BEDTIME FOR 2 DAYS, THEN 1 CAP 2 TIMES DAILY FOR 2 DAYS,THEN 1 CAP 3 TIMES DAILY 06/16/22   Loyola Mast, MD  Multiple Vitamin (MULTIVITAMIN) tablet Take 1 tablet by mouth daily.    [provider]  rOPINIRole (REQUIP) 2 MG tablet Take 1 tablet (2 mg total) by mouth at bedtime. 05/08/22   Loyola Mast, MD  VENTOLIN HFA 108 [phone] Base) MCG/ACT inhaler TAKE 2 PUFFS BY MOUTH EVERY 6 HOURS AS NEEDED FOR WHEEZE OR SHORTNESS OF BREATH 11/10/21   Loyola Mast, MD      Allergies    Codeine and Morphine    Review of Systems   Review of Systems  Physical  Exam Updated Vital Signs BP (!) 155/117 (BP Location: Right Arm)   Pulse 95   Temp 98.5 F (36.9 C) (Oral)   Resp 18   Ht 5\' 3"  (1.6 m)   Wt 77.9 kg   SpO2 98%   BMI 30.43 kg/m  Physical Exam Constitutional:      Comments: Nontoxic clinically well in appearance.  Well-nourished well-developed.  HENT:     Head: Normocephalic and atraumatic.     Right Ear: Tympanic membrane normal.     Left Ear: Tympanic membrane normal.     Nose: Nose normal.     Mouth/Throat:     Mouth: Mucous membranes are moist.     Pharynx: Oropharynx is clear.  Eyes:     Extraocular Movements: Extraocular movements intact.     Conjunctiva/sclera: Conjunctivae normal.     Pupils: Pupils are equal, round, and reactive to light.  Cardiovascular:     Rate and Rhythm: Normal rate and regular  rhythm.  Pulmonary:     Effort: Pulmonary effort is normal.     Breath sounds: Normal breath sounds.  Abdominal:     General: There is no distension.     Palpations: Abdomen is soft.     Tenderness: There is no abdominal tenderness. There is no guarding.  Musculoskeletal:        General: No swelling or tenderness. Normal range of motion.     Cervical back: Neck supple.     Right lower leg: No edema.     Left lower leg: No edema.  Skin:    General: Skin is warm and dry.  Neurological:     General: No focal deficit present.     Mental Status: She is oriented to person, place, and time.     Cranial Nerves: No cranial nerve deficit.     Motor: No weakness.     Coordination: Coordination normal.     Comments: Cognitive function normal.  Speech clear.  CN II through XII intact.  Finger-nose intact and normal.  Psychiatric:        Mood and Affect: Mood normal.     ED Results / Procedures / Treatments   Labs (all labs ordered are listed, but only abnormal results are displayed) Labs Reviewed  COMPREHENSIVE METABOLIC PANEL - Abnormal; Notable for the following components:      Result Value   Glucose, Bld 112 (*)    Calcium 8.3 (*)    Total Protein 6.1 (*)    All other components within normal limits  URINALYSIS, ROUTINE W REFLEX MICROSCOPIC - Abnormal; Notable for the following components:   Leukocytes,Ua SMALL (*)    All other components within normal limits  SARS CORONAVIRUS 2 BY RT PCR  LIPASE, BLOOD  CBC WITH DIFFERENTIAL/PLATELET  TROPONIN I (HIGH SENSITIVITY)  TROPONIN I (HIGH SENSITIVITY)    EKG EKG Interpretation Date/Time:  Wednesday August 16 2022 19:28:02 EDT Ventricular Rate:  66 PR Interval:  175 QRS Duration:  98 QT Interval:  412 QTC Calculation: 432 R Axis:   46  Text Interpretation: Sinus rhythm Low voltage, precordial leads no ischemic appearance. no old comparison Confirmed by Arby Barrette [phone]) on 08/16/2022 8:40:43 PM  Radiology No results  found.  Procedures Procedures    Medications Ordered in ED Medications - No data to display  ED Course/ Medical Decision Making/ A&P  Medical Decision Making Amount and/or Complexity of Data Reviewed Labs: ordered.   Patient presents with a generalized feeling of nausea and dizziness.  Dizziness is not clearly vertiginous.  There is some element that is exacerbated by position change.  No chest pain and no headache.  At this time patient's neurologic exam is normal and she does not describe any strokelike symptoms.  Patient has had some recent medication changes that may account for symptoms.  Will proceed with diagnostic evaluation including lab work for electrolyte derangement\possible dehydration\anemia\infection such as COVID.  Will check orthostatic blood pressures as well.  COVID-negative, troponin less than 2, lipase 24, comprehensive metabolic panel within normal limits except calcium 8.3 total protein 6.1 normal LFTs.  CBC normal with normal differential.  Urinalysis negative.  Patient is clinically well appearance.  She has normal neurologic exam, orthostatic vital signs are normal.  Diagnostic workup is unremarkable.  At this time this may be medication reaction with lightheadedness, nausea and malaise.  Possibly other viral illness however patient is not significantly ill in appearance.  Stable for discharge at this time.  Patient has follow-up scheduled with PCP tomorrow.  We reviewed careful return precautions and prescribe Zofran for nausea as needed.        Final Clinical Impression(s) / ED Diagnoses Final diagnoses:  Nausea  Malaise and fatigue  Lightheadedness    Rx / DC Orders ED Discharge Orders          Ordered    ondansetron (ZOFRAN-ODT) 4 MG disintegrating tablet  Every 4 hours PRN        08/16/22 2047              Arby Barrette, MD 08/16/22 2053

## 2022-08-16 NOTE — ED Triage Notes (Signed)
Pt biba from home. Pt reports nausea and dizziness for the past day. States MD changed her depression medication and she started to feel this way after taking the new medication. No other complaints reported. AAOX4, MAE, resp even and unlabored. NAD. IV access obtained by medic in route-20Lhand 4mg  of Zofran given.

## 2022-08-16 NOTE — Telephone Encounter (Signed)
Caller Name: Cheryl Yu Ph #: [phone] Chief Complaint: Pt started a change of medicine and is now feeling dizzy, has chills, and no appetite. The dizziness is all the time but worse when standing.  She was hesitant to be sent to NT due to the last me she had to wait at the ED for so long.   This call was transferred to Nurse Triage/Access Nurse. This is for documentation purposes. No follow up required at this time.

## 2022-08-16 NOTE — ED Notes (Addendum)
Pt expressed dizziness with exertion.

## 2022-08-16 NOTE — Discharge Instructions (Signed)
1.  May take Zofran every 4 hours as needed for nausea. 2.  Try to rest and hydrate. 3.  Follow-up with your doctor tomorrow as planned. 4.  Return to emergency department immediately if you get a bad headache, blurred or double vision, weak or numb extremities or difficulty walking due to imbalance or other concerning symptoms.

## 2022-08-16 NOTE — Telephone Encounter (Signed)
 Noted. Dm/cma  

## 2022-08-17 ENCOUNTER — Ambulatory Visit (INDEPENDENT_AMBULATORY_CARE_PROVIDER_SITE_OTHER): Payer: Medicare Other | Admitting: Internal Medicine

## 2022-08-17 ENCOUNTER — Encounter: Payer: Self-pay | Admitting: Internal Medicine

## 2022-08-17 VITALS — BP 120/70 | HR 68 | Temp 97.8°F | Ht 63.0 in | Wt 166.4 lb

## 2022-08-17 DIAGNOSIS — R110 Nausea: Secondary | ICD-10-CM

## 2022-08-17 DIAGNOSIS — F411 Generalized anxiety disorder: Secondary | ICD-10-CM

## 2022-08-17 DIAGNOSIS — R42 Dizziness and giddiness: Secondary | ICD-10-CM

## 2022-08-17 NOTE — Progress Notes (Signed)
[facility] [address] [place] [address] Dept: [phone] Dept Fax: [phone]  Acute Care Office Visit  Subjective:   Cheryl Yu 12-14-1945 08/17/2022  No chief complaint on file.   HPI: Discussed the use of AI scribe software for clinical note transcription with the patient, who gave verbal consent to proceed.  History of Present Illness   The patient, with a history of shingles and syncope, presents with dizziness and nausea. Dizziness is only upon standing, but does not describe the dizziness as a spinning sensation. She believes these symptoms are due to an abrupt discontinuation of gabapentin, which she had been taking for about a year for shingles. The patient had been given tapering instructions of 2 tablets of gabapentin 300mg  x 1 week, and then 1 tablet gabapentin 300mg  x 1 week at her last visit with PCP on July 8th. Patient states that she only took 3 days of the 300mg  once daily instead of 1 week. She had also been tapered off her wellbutrin and re-started the celexa, which she completed as directed by her PCP.  Patient went to ER yesterday for symptoms, EKG and lab work were unremarkable. Patient was discharged home with Rx Zofran for nausea. She states this has been helping her nausea. She reports a slight improvement in her dizziness. Daughter, Selena Batten, present at bedside states the patient is doing much better since yesterday.No fever, chills, vomiting, syncope.    The following portions of the patient's history were reviewed and updated as appropriate: past medical history, past surgical history, family history, social history, allergies, medications, and problem list.   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/2021   Osteoporosis without current pathological fracture 12/07/2020   Gastroesophageal reflux disease without esophagitis 12/07/2020   Generalized anxiety disorder 12/07/2020   Past Medical History:  Diagnosis Date   Allergy    Arthritis    Depression    GERD (gastroesophageal reflux disease)    Syncope    Past Surgical History:  Procedure Laterality Date   ABDOMINAL HYSTERECTOMY     Took out Uterus   BREAST ENHANCEMENT SURGERY  1990   silicone   CHOLECYSTECTOMY  1977   Open   complete bladder reconstruction     TRABECULECTOMY     Family History  Problem Relation Age of Onset   Arthritis Mother    Cancer Mother        Ovarian   Hearing loss Mother    Stroke Mother    COPD Father    Cancer Sister        Pancreatic   Early death Sister    Kidney disease Sister    Depression Daughter    Alcohol abuse Son    Depression Son    Diabetes Son    Arthritis Maternal Grandmother    Heart disease Maternal Grandmother    Cancer Maternal Grandfather    Early death Maternal Grandfather    Outpatient Medications Prior to Visit  Medication Sig Dispense Refill   cholecalciferol (VITAMIN D3) 25 MCG (1000 UNIT) tablet Take 1,000 Units by mouth daily.     citalopram (CELEXA) 20 MG tablet Take 0.5 tablets (10 mg total) by mouth daily for 8 days, THEN 1 tablet (  20 mg total) daily. 30 tablet 3   famotidine (PEPCID) 20 MG tablet Take 1 tablet (20 mg total) by mouth 2 (two) times daily. 60 tablet 11   Multiple Vitamin (MULTIVITAMIN) tablet Take 1 tablet by mouth daily.     ondansetron (ZOFRAN-ODT) 4 MG disintegrating tablet Take 1 tablet (4 mg total) by mouth every 4 (four) hours as needed for nausea or vomiting. 20 tablet 0   rOPINIRole (REQUIP) 2 MG tablet Take 1 tablet (2 mg total) by mouth at bedtime. 30 tablet 11   VENTOLIN HFA 108 (90 Base) MCG/ACT inhaler TAKE 2 PUFFS BY MOUTH EVERY 6 HOURS AS NEEDED FOR WHEEZE OR  SHORTNESS OF BREATH 18 each 1   ASPIRIN 81 PO Take by mouth.     buPROPion (WELLBUTRIN SR) 200 MG 12 hr tablet Take 1 tablet (200 mg total) by mouth 2 (two) times daily. 180 tablet 3   gabapentin (NEURONTIN) 300 MG capsule Take 1 capsule (300 mg total) by mouth 3 (three) times daily. TAKE 1 CAPSULE BY MOUTH AT BEDTIME FOR 2 DAYS, THEN 1 CAP 2 TIMES DAILY FOR 2 DAYS,THEN 1 CAP 3 TIMES DAILY 90 capsule 3   No facility-administered medications prior to visit.   Allergies  Allergen Reactions   Codeine Other (See Comments)   Morphine Other (See Comments)     ROS: A complete ROS was performed with pertinent positives/negatives noted in the HPI. The remainder of the ROS are negative.    Objective:   Today's Vitals   08/17/22 0913  BP: 120/70  Pulse: 68  Temp: 97.8 F (36.6 C)  TempSrc: Temporal  SpO2: 98%  Weight: 166 lb 6.4 oz (75.5 kg)  Height: 5\' 3"  (1.6 m)    GENERAL: Well-appearing, in NAD. Well nourished.  SKIN: Pink, warm and dry. No rash, lesion, ulceration, or ecchymoses.  EYES: PERRLA. EOMI. No nystagmus.  NECK: Trachea midline. Full ROM w/o pain or tenderness. No lymphadenopathy.  RESPIRATORY: Chest wall symmetrical. Respirations even and non-labored. Breath sounds clear to auscultation bilaterally.  CARDIAC: S1, S2 present, regular rate and rhythm. Peripheral pulses 2+ bilaterally.  MSK: Muscle tone and strength appropriate for age. EXTREMITIES: Without clubbing, cyanosis, or edema.  NEUROLOGIC: No motor or sensory deficits. Cn 2-12 intact. Normal finger to nose. 5/5 upper and lower extremity strength. Negative pronator drift. Steady, even gait.  PSYCH/MENTAL STATUS: Alert, oriented x 3. Cooperative, appropriate mood and affect.    No results found for any visits on 08/17/22.    Assessment & Plan:  Assessment and Plan    Dizziness on standing:  No definitive vertigo present. It is likely due to the abrupt cessation of Gabapentin. -Stay well hydrated. -Do not  restart Gabapentin to complete the taper. -Monitor symptoms and return if dizziness changes or if symptoms worsen.  Nausea:  Continue Zofran as needed for nausea  Bland diet  Depression: Transitioning from Wellbutrin to Celexa. Currently on 10mg  of Celexa and will increase to 20mg  after 8 days. -Continue current plan of medication transition.      No orders of the defined types were placed in this encounter.  Lab Orders  No laboratory test(s) ordered today   No images are attached to the encounter or orders placed in the encounter.  Return for Scheduled Routine Office Visits and as needed.   Of note, portions of this note may have been created with voice recognition software Physicist, medical). While this note has been edited for accuracy, occasional wrong-word or 'sound-a-like' substitutions  may have occurred due to the inherent limitations of voice recognition software.  Salvatore Decent, FNP

## 2022-08-23 ENCOUNTER — Telehealth: Payer: Self-pay | Admitting: *Deleted

## 2022-08-23 NOTE — Transitions of Care (Post Inpatient/ED Visit) (Signed)
   08/23/2022  Name: Trust Crago MRN: [id number] DOB: 03-21-1945  Today's TOC FU Call Status: Today's TOC FU Call Status:: Unsuccessul Call (1st Attempt) Unsuccessful Call (1st Attempt) Date: 08/23/22  Attempted to reach the patient regarding the most recent Inpatient/ED visit.  Follow Up Plan: Additional outreach attempts will be made to reach the patient to complete the Transitions of Care (Post Inpatient/ED visit) call.   Gean Maidens BSN RN [facility] [phone]

## 2022-08-25 ENCOUNTER — Telehealth: Payer: Self-pay | Admitting: *Deleted

## 2022-08-25 NOTE — Transitions of Care (Post Inpatient/ED Visit) (Signed)
08/25/2022  Name: Cheryl Yu MRN: [id number] DOB: [birth year]  Today's TOC FU Call Status: Today's TOC FU Call Status:: Successful TOC FU Call Competed TOC FU Call Complete Date: 08/25/22  Transition Care Management Follow-up Telephone Call Date of Discharge: 08/16/22 Discharge Facility: [facility]) Type of Discharge: Emergency Department Reason for ED Visit: Other: (nausea and dizziness) How have you been since you were released from the hospital?: Better Any questions or concerns?: No  Items Reviewed: Did you receive and understand the discharge instructions provided?: Yes Medications obtained,verified, and reconciled?: Yes (Medications Reviewed) Any new allergies since your discharge?: No Dietary orders reviewed?: No Do you have support at home?: Yes People in Home: alone Name of Support/Comfort Primary Source: Cala Bradford  Medications Reviewed Today: Medications Reviewed Today     Reviewed by Luella Cook, RN (Case Manager) on 08/25/22 at 1706  Med List Status: <None>   Medication Order Taking? Sig Documenting Provider Last Dose Status Informant  ASPIRIN 81 PO [id number] Yes Take by mouth. [provider] Taking Active   cholecalciferol (VITAMIN D3) 25 MCG (1000 UNIT) tablet [id number] Yes Take 1,000 Units by mouth daily. [provider] Taking Active   citalopram (CELEXA) 20 MG tablet [id number] Yes Take 0.5 tablets (10 mg total) by mouth daily for 8 days, THEN 1 tablet (20 mg total) daily. Loyola Mast, MD Taking Active   famotidine (PEPCID) 20 MG tablet [id number] Yes Take 1 tablet (20 mg total) by mouth 2 (two) times daily. Loyola Mast, MD Taking Active   Multiple Vitamin (MULTIVITAMIN) tablet [id number] Yes Take 1 tablet by mouth daily. [provider] Taking Active   ondansetron (ZOFRAN-ODT) 4 MG disintegrating tablet [id number] Yes Take 1 tablet (4 mg total) by mouth every 4 (four) hours as needed for nausea or vomiting. Arby Barrette, MD Taking Active   rOPINIRole (REQUIP) 2 MG tablet [id number] Yes Take 1 tablet (2 mg total) by mouth at bedtime. Loyola Mast, MD Taking Active   VENTOLIN HFA 108 [phone] Base) MCG/ACT inhaler [id number] Yes TAKE 2 PUFFS BY MOUTH EVERY 6 HOURS AS NEEDED FOR WHEEZE OR SHORTNESS OF BREATH Loyola Mast, MD Taking Active             Home Care and Equipment/Supplies: Were Home Health Services Ordered?: NA Any new equipment or medical supplies ordered?: NA  Functional Questionnaire: Do you need assistance with bathing/showering or dressing?: No Do you need assistance with meal preparation?: No Do you need assistance with eating?: No Do you have difficulty maintaining continence: No Do you need assistance with getting out of bed/getting out of a chair/moving?: No Do you have difficulty managing or taking your medications?: No  Follow up appointments reviewed: PCP Follow-up appointment confirmed?: Yes Date of PCP follow-up appointment?: 08/17/22 Follow-up Provider: Salvatore Decent [facility] Follow-up appointment confirmed?: NA Do you need transportation to your follow-up appointment?: No Do you understand care options if your condition(s) worsen?: Yes-patient verbalized understanding  SDOH Interventions Today    Flowsheet Row Most Recent Value  SDOH Interventions   Food Insecurity Interventions Intervention Not Indicated  Housing Interventions Intervention Not Indicated  Transportation Interventions Intervention Not Indicated, Patient Resources (Friends/Family)      Interventions Today    Flowsheet Row Most Recent Value  General Interventions   General Interventions Discussed/Reviewed General Interventions Discussed, General Interventions Reviewed, Doctor Visits  Doctor Visits Discussed/Reviewed Doctor Visits Discussed, Doctor Visits Reviewed  Pharmacy Interventions   Pharmacy Dicussed/Reviewed Pharmacy Topics Discussed, Pharmacy  Topics Reviewed      TOC  Interventions Today    Flowsheet Row Most Recent Value  TOC Interventions   TOC Interventions Discussed/Reviewed TOC Interventions Discussed, TOC Interventions Reviewed       Gean Maidens BSN RN [facility] [phone]

## 2022-09-14 ENCOUNTER — Encounter (INDEPENDENT_AMBULATORY_CARE_PROVIDER_SITE_OTHER): Payer: Self-pay

## 2022-09-29 ENCOUNTER — Telehealth: Payer: Self-pay | Admitting: Family Medicine

## 2022-09-29 NOTE — Telephone Encounter (Signed)
Patient notified VIA phone. Dm/cma  

## 2022-09-29 NOTE — Telephone Encounter (Signed)
Pt expressed that her anxiety is at it's peak. She feels like she is climbing out of her skin. She is asking if she can double up on her  citalopram (CELEXA) 20 MG tablet [[id number]].  She wanted an appt but none open. She cannot wait until Tuesday, but did not want to take more without Dr Rudds approval.   Please advise would like a call.  [phone]

## 2022-10-09 ENCOUNTER — Ambulatory Visit (INDEPENDENT_AMBULATORY_CARE_PROVIDER_SITE_OTHER): Payer: Medicare Other | Admitting: Family Medicine

## 2022-10-09 ENCOUNTER — Encounter: Payer: Self-pay | Admitting: Family Medicine

## 2022-10-09 VITALS — BP 134/70 | HR 78 | Temp 98.0°F | Ht 63.0 in | Wt 168.2 lb

## 2022-10-09 DIAGNOSIS — I809 Phlebitis and thrombophlebitis of unspecified site: Secondary | ICD-10-CM | POA: Diagnosis not present

## 2022-10-09 DIAGNOSIS — F411 Generalized anxiety disorder: Secondary | ICD-10-CM

## 2022-10-09 DIAGNOSIS — G2581 Restless legs syndrome: Secondary | ICD-10-CM | POA: Diagnosis not present

## 2022-10-09 DIAGNOSIS — K219 Gastro-esophageal reflux disease without esophagitis: Secondary | ICD-10-CM

## 2022-10-09 DIAGNOSIS — M1611 Unilateral primary osteoarthritis, right hip: Secondary | ICD-10-CM

## 2022-10-09 DIAGNOSIS — G4700 Insomnia, unspecified: Secondary | ICD-10-CM | POA: Insufficient documentation

## 2022-10-09 MED ORDER — CITALOPRAM HYDROBROMIDE 40 MG PO TABS
40.0000 mg | ORAL_TABLET | Freq: Every day | ORAL | 3 refills | Status: DC
Start: 2022-10-09 — End: 2022-11-06

## 2022-10-09 MED ORDER — OMEPRAZOLE 20 MG PO CPDR: 20.0000 mg | DELAYED_RELEASE_CAPSULE | Freq: Every day | ORAL | 3 refills | Status: DC

## 2022-10-09 NOTE — Assessment & Plan Note (Signed)
Having occasional breakthrough heartburn with use of H2 blocker. I recommend we step her therapy back up to a PPI for long term control.

## 2022-10-09 NOTE — Assessment & Plan Note (Signed)
Currently using a daily aspirin. I recommend she try taking Tylenol PM at bedtime to see if this will reduce nighttime discomfort.

## 2022-10-09 NOTE — Assessment & Plan Note (Signed)
Continues to have some increased anxiety. She recently increased her citalopram to 40 mg daily. Recommend we reevaluate after 6 weeks.

## 2022-10-09 NOTE — Assessment & Plan Note (Signed)
Stable. Has obtained a wheelchair for outings with family.

## 2022-10-09 NOTE — Progress Notes (Signed)
[facility] [address] [place] [address] Dept: [phone] Dept Fax: [phone]  Chronic Care Office Visit  Subjective:    Patient ID: Cheryl Yu, female    DOB: [birth year], 77 y.o..   MRN: [id number]  Chief Complaint  Patient presents with   Follow-up    2 month f/u.  Still having anxiety and restless legs. Declines flu shot.     History of Present Illness:  Patient is in today for reassessment of chronic medical issues.  Ms. Drum has a history of generalized anxiety with depressive symptoms. She is currently managed on citalopram. We have been titrating up on her dose. She just started to take 40 mg daily about 10 days ago. She feels it is maybe starting to have an effect. She notes one of her bigger issues is feeling like she needs to move frequently (i.e., not being able to sit still).   Ms. Tornetta has a history of shingles, which developed around 01/18/2022. She had been on gabapentin for PHN. At her last visit, we discussed tapering off of this. Despite that discussion, she rapidly stopped the medicine. She did experience significant withdrawal symptoms. She is relieved that the withdrawal has subsided.   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 She notes it has become progressively harder for her to ambulate for long distances, However, she is not interested in surgical options at this point.  She has been able to get a wheelchair for when she has to go a block or more, which has really helped.   Ms. Torbet has a history of restless legs. She is managed on ropinirole 2 mg at bedtime. She feels this is not as effective as she would like. She notes the restless feeling occurs starting late in the afternoon and continuing at night.  Ms. Chastain notes insomnia issues. She is able to fall asleep without difficulty, but then awakens after about 4 hours. She wonders if some of this could be due to discomfort  she has with her chornic phlebitis. She will take an ibuprofen at night when she wakes up, but then has trouble getting abck to sleep.  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   Osteoporosis without current pathological fracture 12/07/2020   Gastroesophageal reflux disease without esophagitis 12/07/2020   Generalized anxiety disorder 12/07/2020   Past Surgical History:  Procedure Laterality Date   ABDOMINAL HYSTERECTOMY     Took out Uterus   BREAST ENHANCEMENT SURGERY  1990   silicone   CHOLECYSTECTOMY  1977   Open   complete bladder reconstruction     TRABECULECTOMY     Family History  Problem Relation Age of Onset   Arthritis Mother    Cancer Mother        Ovarian   Hearing loss Mother    Stroke Mother    COPD Father    Cancer Sister        Pancreatic   Early death Sister    Kidney disease Sister    Depression Daughter    Alcohol abuse Son    Depression Son    Diabetes Son    Arthritis Maternal Grandmother    Heart  disease Maternal Grandmother    Cancer Maternal Grandfather    Early death Maternal Grandfather    Outpatient Medications Prior to Visit  Medication Sig Dispense Refill   ASPIRIN 81 PO Take by mouth.     cholecalciferol (VITAMIN D3) 25 MCG (1000 UNIT) tablet Take 1,000 Units by mouth daily.     Multiple Vitamin (MULTIVITAMIN) tablet Take 1 tablet by mouth daily.     rOPINIRole (REQUIP) 2 MG tablet Take 1 tablet (2 mg total) by mouth at bedtime. 30 tablet 11   VENTOLIN HFA 108 (90 Base) MCG/ACT inhaler TAKE 2 PUFFS BY MOUTH EVERY 6 HOURS AS NEEDED FOR WHEEZE OR SHORTNESS OF BREATH 18 each 1   citalopram (CELEXA) 20 MG tablet Take 0.5 tablets (10 mg  total) by mouth daily for 8 days, THEN 1 tablet (20 mg total) daily. 30 tablet 3   famotidine (PEPCID) 20 MG tablet Take 1 tablet (20 mg total) by mouth 2 (two) times daily. 60 tablet 11   ondansetron (ZOFRAN-ODT) 4 MG disintegrating tablet Take 1 tablet (4 mg total) by mouth every 4 (four) hours as needed for nausea or vomiting. 20 tablet 0   No facility-administered medications prior to visit.   Allergies  Allergen Reactions   Codeine Other (See Comments)   Morphine Other (See Comments)   Objective:   Today's Vitals   10/09/22 1048  BP: 134/70  Pulse: 78  Temp: 98 F (36.7 C)  TempSrc: Temporal  SpO2: 98%  Weight: 168 lb 3.2 oz (76.3 kg)  Height: 5\' 3"  (1.6 m)   Body mass index is 29.8 kg/m.   General: Well developed, well nourished. No acute distress. Psych: Alert and oriented. Normal mood and affect.  There are no preventive care reminders to display for this patient.    Assessment & Plan:   Problem List Items Addressed This Visit       Cardiovascular and Mediastinum   Recurrent phlebitis of superficial vein - Primary    Currently using a daily aspirin. I recommend she try taking Tylenol PM at bedtime to see if this will reduce nighttime discomfort.        Digestive   Gastroesophageal reflux disease without esophagitis    Having occasional breakthrough heartburn with use of H2 blocker. I recommend we step her therapy back up to a PPI for long term control.      Relevant Medications   omeprazole (PRILOSEC) 20 MG capsule     Musculoskeletal and Integument   Degenerative arthritis of hip- Right    Stable. Has obtained a wheelchair for outings with family.        Other   Generalized anxiety disorder    Continues to have some increased anxiety. She recently increased her citalopram to 40 mg daily. Recommend we reevaluate after 6 weeks.      Relevant Medications   citalopram (CELEXA) 40 MG tablet   Insomnia    We will continue to try and improve her  anxiety control. Additionally, discussed use of Tylenol PM for nighttime pain management.      Relevant Orders   Ambulatory referral to Neurology   Restless legs    Still not well controlled with ropinorole 2 mg at bedtime. I will refer her to neurology for recommendations regarding management.      Relevant Orders   Ambulatory referral to Neurology    Return in about 4 weeks (around 11/06/2022) for Reassessment.   Loyola Mast, MD

## 2022-10-09 NOTE — Assessment & Plan Note (Signed)
We will continue to try and improve her anxiety control. Additionally, discussed use of Tylenol PM for nighttime pain management.

## 2022-10-09 NOTE — Assessment & Plan Note (Signed)
Still not well controlled with ropinorole 2 mg at bedtime. I will refer her to neurology for recommendations regarding management.

## 2022-11-06 ENCOUNTER — Encounter: Payer: Self-pay | Admitting: Family Medicine

## 2022-11-06 ENCOUNTER — Ambulatory Visit (INDEPENDENT_AMBULATORY_CARE_PROVIDER_SITE_OTHER): Payer: Medicare Other | Admitting: Family Medicine

## 2022-11-06 VITALS — BP 134/76 | HR 61 | Temp 97.9°F | Ht 63.0 in | Wt 170.4 lb

## 2022-11-06 DIAGNOSIS — F411 Generalized anxiety disorder: Secondary | ICD-10-CM | POA: Diagnosis not present

## 2022-11-06 DIAGNOSIS — M1611 Unilateral primary osteoarthritis, right hip: Secondary | ICD-10-CM | POA: Diagnosis not present

## 2022-11-06 MED ORDER — VENLAFAXINE HCL ER 37.5 MG PO CP24: 37.5000 mg | ORAL_CAPSULE | Freq: Every day | ORAL | 5 refills | Status: DC

## 2022-11-06 MED ORDER — CITALOPRAM HYDROBROMIDE 10 MG PO TABS: 10.0000 mg | ORAL_TABLET | Freq: Every day | ORAL | 0 refills | Status: DC

## 2022-11-06 MED ORDER — CITALOPRAM HYDROBROMIDE 40 MG PO TABS
20.0000 mg | ORAL_TABLET | Freq: Every day | ORAL | Status: DC
Start: 2022-11-06 — End: 2023-01-08

## 2022-11-06 NOTE — Patient Instructions (Signed)
Take citalopram 40 mg, 1/2 tablet daily (20 mg dose) for 7 days, then Take citalopram 10 mg daily for 7 days, then stop After 24 hours, start venlafaxine 37.5 mg daily.

## 2022-11-06 NOTE — Progress Notes (Signed)
[facility] [address] [place] [address] Dept: [phone] Dept Fax: [phone]  Office Visit  Subjective:    Patient ID: Cheryl Yu, female    DOB: [birth year], 77 y.o..   MRN: [id number]  Chief Complaint  Patient presents with   Follow-up    1 month f/u. C/o still feeling jittery and still waking up 2-3 times a night.  Taking Tylenol.    History of Present Illness:  Ms. Kilmer has a history of generalized anxiety with depressive symptoms. She is currently managed on citalopram. She had titrated up to 40 mg daily, however, she is not really noting any benefit from this. She continues to have some generalized anxiousness, noting she feels like she is "waiting for the other shoe to drop". She does have a pending appointment with neurology related to her restless leg issues.  Ms. Rusconi has a history of right hip arthritis. She notes it does wake her up at night and causes her difficulty with sleep. She has tried taking Tylenol PM, but finds it is not controlling her discomfort. She does take naproxen 220 mg 2 tabs daily and it manages her daytime pain okay.  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   Osteoporosis without current pathological fracture 12/07/2020   Gastroesophageal reflux disease without esophagitis 12/07/2020   Generalized anxiety disorder 12/07/2020   Past Surgical History:  Procedure Laterality Date   ABDOMINAL HYSTERECTOMY     Took out Uterus   BREAST ENHANCEMENT SURGERY  1990   silicone   CHOLECYSTECTOMY  1977   Open   complete bladder reconstruction  2019   TRABECULECTOMY     Family  History  Problem Relation Age of Onset   Arthritis Mother    Cancer Mother        Ovarian   Hearing loss Mother    Stroke Mother    COPD Father    Cancer Sister        Pancreatic   Early death Sister    Kidney disease Sister    Depression Daughter    Alcohol abuse Son    Depression Son    Diabetes Son    Arthritis Maternal Grandmother    Heart disease Maternal Grandmother    Cancer Maternal Grandfather    Early death Maternal Grandfather    Outpatient Medications Prior to Visit  Medication Sig Dispense Refill   ASPIRIN 81 PO Take by mouth.     cholecalciferol (VITAMIN D3) 25 MCG (1000 UNIT) tablet Take 1,000 Units by mouth daily.     Multiple Vitamin (MULTIVITAMIN) tablet Take 1 tablet by mouth daily.     omeprazole (PRILOSEC) 20 MG capsule Take 1 capsule (20 mg total) by mouth daily. 30 capsule 3   rOPINIRole (REQUIP) 2 MG tablet Take 1 tablet (2 mg total) by mouth at bedtime. 30 tablet 11   VENTOLIN HFA 108 (90 Base) MCG/ACT inhaler TAKE 2 PUFFS BY MOUTH EVERY 6 HOURS AS NEEDED FOR WHEEZE OR SHORTNESS OF BREATH 18 each 1   citalopram (CELEXA) 40 MG tablet Take 1 tablet (40 mg total) by mouth daily. 90 tablet 3   No facility-administered medications prior to visit.  Allergies  Allergen Reactions   Codeine Other (See Comments)   Morphine Other (See Comments)     Objective:   Today's Vitals   11/06/22 1018  BP: 134/76  Pulse: 61  Temp: 97.9 F (36.6 C)  TempSrc: Temporal  SpO2: 100%  Weight: 170 lb 6.4 oz (77.3 kg)  Height: 5\' 3"  (1.6 m)   Body mass index is 30.19 kg/m.   General: Well developed, well nourished. No acute distress. Psych: Alert and oriented. Normal mood and affect.  Health Maintenance Due  Topic Date Due   COVID-19 Vaccine (3 - 2023-24 season) 10/01/2022     Assessment & Plan:   Problem List Items Addressed This Visit       Musculoskeletal and Integument   Degenerative arthritis of hip- Right    We discussed her adding in a bedtime  dose of naproxen 220 mg.        Other   Generalized anxiety disorder - Primary    Ms. Hamill does not appear to be responding well to the citalopram. I recommend we taper her down off of the medication and then start her on venlafaxine. I will also refer her to behavioral health.      Relevant Medications   venlafaxine XR (EFFEXOR XR) 37.5 MG 24 hr capsule   citalopram (CELEXA) 10 MG tablet   citalopram (CELEXA) 40 MG tablet   Other Relevant Orders   Ambulatory referral to Psychology    Return in about 2 months (around 01/06/2023) for Reassessment.   Loyola Mast, MD

## 2022-11-06 NOTE — Assessment & Plan Note (Signed)
We discussed her adding in a bedtime dose of naproxen 220 mg.

## 2022-11-06 NOTE — Assessment & Plan Note (Signed)
Cheryl Yu does not appear to be responding well to the citalopram. I recommend we taper her down off of the medication and then start her on venlafaxine. I will also refer her to behavioral health.

## 2022-11-13 ENCOUNTER — Ambulatory Visit (INDEPENDENT_AMBULATORY_CARE_PROVIDER_SITE_OTHER): Payer: Medicare Other | Admitting: Neurology

## 2022-11-13 ENCOUNTER — Encounter: Payer: Self-pay | Admitting: Neurology

## 2022-11-13 VITALS — BP 132/78 | HR 63 | Ht 63.0 in | Wt 167.0 lb

## 2022-11-13 DIAGNOSIS — R0683 Snoring: Secondary | ICD-10-CM

## 2022-11-13 DIAGNOSIS — G2581 Restless legs syndrome: Secondary | ICD-10-CM

## 2022-11-13 DIAGNOSIS — R519 Headache, unspecified: Secondary | ICD-10-CM

## 2022-11-13 DIAGNOSIS — Z9189 Other specified personal risk factors, not elsewhere classified: Secondary | ICD-10-CM | POA: Diagnosis not present

## 2022-11-13 DIAGNOSIS — E611 Iron deficiency: Secondary | ICD-10-CM | POA: Diagnosis not present

## 2022-11-13 DIAGNOSIS — D508 Other iron deficiency anemias: Secondary | ICD-10-CM

## 2022-11-13 DIAGNOSIS — R351 Nocturia: Secondary | ICD-10-CM

## 2022-11-13 NOTE — Patient Instructions (Addendum)
Thank you for choosing [facility] for your sleep related care! It was nice to meet you today!   Here is what we discussed today:   Reduce your caffeine intake (less than 2 servings per day, including coffee and soda). Increase your water intake to 3-4 bottles/day (16.9 oz size each).  Split your ropinirole: take 1/2 pill at 3 PM and 1/2 pill at 6 PM.  We will check blood work today and call you with the test results. Based on your symptoms and your exam I believe you are at risk for obstructive sleep apnea (aka OSA). We should proceed with a sleep study to determine whether you do or do not have OSA and how severe it is. Even, if you have mild OSA, I may want you to consider treatment with CPAP, as treatment of even borderline or mild sleep apnea can result and improvement of symptoms such as sleep disruption, daytime sleepiness, nighttime bathroom breaks, restless leg symptoms, improvement of headache syndromes, even improved mood disorder.   As explained, an attended sleep study (meaning you get to stay overnight in the sleep lab), lets [place] monitor sleep-related behaviors such as sleep talking and leg movements in sleep, in addition to monitoring for sleep apnea.  A home sleep test is a screening tool for sleep apnea diagnosis only, but unfortunately, does not help with any other sleep-related diagnoses.  Please remember, the long-term risks and ramifications of untreated moderate to severe obstructive sleep apnea may include (but are not limited to): increased risk for cardiovascular disease, including congestive heart failure, stroke, difficult to control hypertension, treatment resistant obesity, arrhythmias, especially irregular heartbeat commonly known as A. Fib. (atrial fibrillation); even type 2 diabetes has been linked to untreated OSA.   Other correlations that untreated obstructive sleep apnea include macular edema which is swelling of the retina in the eyes, droopy  eyelid syndrome, and elevated hemoglobin and hematocrit levels (often referred to as polycythemia).  Sleep apnea can cause disruption of sleep and sleep deprivation in most cases, which, in turn, can cause recurrent headaches, problems with memory, mood, concentration, focus, and vigilance. Most people with untreated sleep apnea report excessive daytime sleepiness, which can affect their ability to drive. Please do not drive or use heavy equipment or machinery, if you feel sleepy! Patients with sleep apnea can also develop difficulty initiating and maintaining sleep (aka insomnia).   Having sleep apnea may increase your risk for other sleep disorders, including involuntary behaviors sleep such as sleep terrors, sleep talking, sleepwalking.    Having sleep apnea can also increase your risk for restless leg syndrome and leg movements at night.   Please note that untreated obstructive sleep apnea may carry additional perioperative morbidity. Patients with significant obstructive sleep apnea (typically, in the moderate to severe degree) should receive, if possible, perioperative PAP (positive airway pressure) therapy and the surgeons and particularly the anesthesiologists should be informed of the diagnosis and the severity of the sleep disordered breathing.   We will call you or email you through MyChart with regards to your test results and plan a follow-up in sleep clinic accordingly. Most likely, you will hear from one of our nurses.   Our sleep lab administrative assistant will call you to schedule your sleep study and give you further instructions, regarding the check in process for the sleep study, arrival time, what to bring, when you can expect to leave after the study, etc., and to answer any other logistical questions you may have.  If you don't hear back from her by about 2 weeks from now, please feel free to call her direct line at [phone] or you can call our general clinic number, or email  [place] through My Chart.

## 2022-11-13 NOTE — Progress Notes (Signed)
Subjective:    Patient ID: Cheryl Yu is a 77 y.o. female.  HPI    Huston Foley, MD, PhD [facility] [address], [address] [address] [place], [place] [address]  Dear Dr. Veto Kemps,  I saw your patient, Cheryl Yu, upon your kind request in my sleep clinic today for initial consultation of her sleep disorder, in particular, her restless leg syndrome.  The patient is unaccompanied today.  As you know, Ms. Carranza is a 77 year old female with an underlying medical history of allergies, arthritis, reflux disease, history of syncope, anxiety, depression, and borderline obesity, who reports a longstanding history of restless leg symptoms, most of her adult life.  She had symptoms off and on for years.  She has been on ropinirole for the past at least 1 year.  She has been on citalopram for the past 2 to 3 years and is currently transitioning off of it, she will start Effexor XR soon.  She has mostly anxiety, is not bothered by depression, denies any suicidal ideations.  She does not wake up rested.  She has a history of snoring occasionally.  She is not aware of any family history of restless leg syndrome.  She does have a history of iron deficiency and anemia, she reports that iron supplements make her constipation, she also has constipation at baseline.  Her Epworth sleepiness score is 11 out of 24, fatigue severity score is 59 out of 63.  She lives with her daughter, her 69 year old granddaughter, she also takes care of her 68-month-old great granddaughter currently (grandson's child), but this is going to change soon.  She is retired, she is widowed and also divorced twice.  She quit smoking in 1997, she drinks no alcohol currently, she drinks caffeine in the form of coffee, 1 cup in the morning and 2 soda cans per day.  She takes her ropinirole at 6 PM and it helps her go to sleep at night, bedtime is generally around 8 PM and rise time around 5 AM.  She has nocturia about once  per average night and has had rare morning headaches.  She used to have migraines but no longer has them.  I reviewed your office note from 10/09/2022. She had been on gabapentin for postherpetic neuralgia since December 2023 but recently stopped the medication.  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  She has not seen orthopedics yet.  Her Past Medical History Is Significant For: Past Medical History:  Diagnosis Date   Allergy    Arthritis    Depression    GERD (gastroesophageal reflux disease)    Syncope     Her Past Surgical History Is Significant For: Past Surgical History:  Procedure Laterality Date   ABDOMINAL HYSTERECTOMY     Took out Uterus   BREAST ENHANCEMENT SURGERY  1990   silicone   CHOLECYSTECTOMY  1977   Open   complete bladder reconstruction  2019   TRABECULECTOMY      Her Family History Is Significant For: Family History  Problem Relation Age of Onset   Arthritis Mother    Cancer Mother        Ovarian   Hearing loss Mother    Stroke Mother    COPD Father    Cancer Sister        Pancreatic   Early death Sister    Kidney disease Sister    Arthritis Maternal Grandmother    Heart disease Maternal Grandmother    Cancer Maternal  Grandfather    Early death Maternal Grandfather    Depression Daughter    Alcohol abuse Son    Depression Son    Diabetes Son    Sleep apnea Neg Hx     Her Social History Is Significant For: Social History   Socioeconomic History   Marital status: Divorced    Spouse name: Not on file   Number of children: 3   Years of education: Not on file   Highest education level: 12th grade  Occupational History   Occupation: Retired  Tobacco Use   Smoking status: Former    Current packs/day: 0.00    Types: Cigarettes    Quit date: 2000    Years since quitting: 24.8   Smokeless tobacco: Never  Vaping Use   Vaping status: Never Used  Substance and Sexual Activity   Alcohol use: Not Currently    Comment: 1 glass wine  daily   Drug use: Never   Sexual activity: Yes  Other Topics Concern   Not on file  Social History Narrative   Not on file   Social Determinants of Health   Financial Resource Strain: Patient Declined (05/04/2022)   Overall Financial Resource Strain (CARDIA)    Difficulty of Paying Living Expenses: Patient declined  Food Insecurity: No Food Insecurity (08/25/2022)   Hunger Vital Sign    Worried About Running Out of Food in the Last Year: Never true    Ran Out of Food in the Last Year: Never true  Transportation Needs: No Transportation Needs (08/25/2022)   PRAPARE - Administrator, Civil Service (Medical): No    Lack of Transportation (Non-Medical): No  Physical Activity: Unknown (05/04/2022)   Exercise Vital Sign    Days of Exercise per Week: 0 days    Minutes of Exercise per Session: Patient declined  Stress: Stress Concern Present (05/04/2022)   [employer] of Occupational Health - Occupational Stress Questionnaire    Feeling of Stress : To some extent  Social Connections: Socially Isolated (05/04/2022)   Social Connection and Isolation Panel [NHANES]    Frequency of Communication with Friends and Family: More than three times a week    Frequency of Social Gatherings with Friends and Family: More than three times a week    Attends Religious Services: Never    Database administrator or Organizations: No    Attends Banker Meetings: Never    Marital Status: Divorced    Her Allergies Are:  Allergies  Allergen Reactions   Codeine Other (See Comments)   Morphine Other (See Comments)  :   Her Current Medications Are:  Outpatient Encounter Medications as of 11/13/2022  Medication Sig   ASPIRIN 81 PO Take by mouth.   cholecalciferol (VITAMIN D3) 25 MCG (1000 UNIT) tablet Take 1,000 Units by mouth daily.   citalopram (CELEXA) 10 MG tablet Take 1 tablet (10 mg total) by mouth daily. Take as directed to complete taper off of medication.   citalopram  (CELEXA) 40 MG tablet Take 0.5 tablets (20 mg total) by mouth daily for 7 days. Tale as directed to taper off medication.   Multiple Vitamin (MULTIVITAMIN) tablet Take 1 tablet by mouth daily.   omeprazole (PRILOSEC) 20 MG capsule Take 1 capsule (20 mg total) by mouth daily.   rOPINIRole (REQUIP) 2 MG tablet Take 1 tablet (2 mg total) by mouth at bedtime.   VENTOLIN HFA 108 (90 Base) MCG/ACT inhaler TAKE 2 PUFFS BY MOUTH EVERY 6  HOURS AS NEEDED FOR WHEEZE OR SHORTNESS OF BREATH   venlafaxine XR (EFFEXOR XR) 37.5 MG 24 hr capsule Take 1 capsule (37.5 mg total) by mouth daily with breakfast. (Patient not taking: Reported on 11/13/2022)   No facility-administered encounter medications on file as of 11/13/2022.  :   Review of Systems:  Out of a complete 14 point review of systems, all are reviewed and negative with the exception of these symptoms as listed below:  Review of Systems  Neurological:        Pt here for sleep consult Pt snore,fatigue,  Pt denies headaches,hypertension ,sleep study,CPAP machine    ESS:11 FSS:59     Objective:  Neurological Exam  Physical Exam Physical Examination:   Vitals:   11/13/22 0919  BP: 132/78  Pulse: 63    General Examination: The patient is a very pleasant 77 y.o. female in no acute distress. She appears well-developed and well-nourished and well groomed.   HEENT: Normocephalic, atraumatic, pupils are equal, round and reactive to light, corrective eyeglasses in place, bilateral cataracts noted.  Extraocular tracking is good without limitation to gaze excursion or nystagmus noted. Hearing is grossly intact. Face is symmetric with normal facial animation. Speech is clear with no dysarthria noted. There is no hypophonia. There is no lip, neck/head, jaw or voice tremor. Neck is supple with full range of passive and active motion. There are no carotid bruits on auscultation. Oropharynx exam reveals: moderate mouth dryness, adequate dental hygiene and  moderate airway crowding, due to small airway entry.  Tonsils on the smaller side, Mallampati class II.  Neck circumference 14-1/4 inches, minimal overbite noted.  Tongue protrudes centrally and palate elevates symmetrically.  Chest: Clear to auscultation without wheezing, rhonchi or crackles noted.  Heart: S1+S2+0, regular and normal without murmurs, rubs or gallops noted.   Abdomen: Soft, non-tender and non-distended.  Extremities: There is no pitting edema in the distal lower extremities bilaterally.   Skin: Warm and dry without trophic changes noted.   Musculoskeletal: exam reveals no obvious joint deformities.   Neurologically:  Mental status: The patient is awake, alert and oriented in all 4 spheres. Her immediate and remote memory, attention, language skills and fund of knowledge are appropriate. There is no evidence of aphasia, agnosia, apraxia or anomia. Speech is clear with normal prosody and enunciation. Thought process is linear. Mood is normal and affect is normal.  Cranial nerves II - XII are as described above under HEENT exam.  Motor exam: Normal bulk, strength and tone is noted. There is no obvious action or resting tremor.  Fine motor skills and coordination: grossly intact.  Cerebellar testing: No dysmetria or intention tremor. There is no truncal or gait ataxia.  Sensory exam: intact to light touch in the upper and lower extremities.  Gait, station and balance: She stands easily. No veering to one side is noted. No leaning to one side is noted. Posture is age-appropriate and stance is narrow based. Gait shows normal stride length and normal pace. No problems turning are noted.   Assessment and Plan:  In summary, Antonetta Clanton is a very pleasant 77 y.o.-year old female with an underlying medical history of allergies, arthritis, reflux disease, history of syncope, anxiety, depression, and borderline obesity, who presents for evaluation of her sleep to sleep disturbance  including restless leg symptoms, nonrestorative sleep, risk for obstructive sleep apnea.  She has a history of iron deficiency and anemia.  She has been on ropinirole.  I suggested she increase  her water intake and reduce her caffeine intake.  She is advised to split her ropinirole and 2 doses and take 1 dose in the afternoon and the other at 6 PM.  She is advised to get some blood work today, we will check iron studies and thyroid function screening test.  We will also check her B12 vitamin.  We will proceed with a sleep study to rule out an underlying organic sleep disorder including periodic leg movements of sleep as well as sleep disordered breathing.  If she has obstructive sleep apnea we will likely consider a PAP machine.  She is advised that we will keep her posted as to her test results by phone call and plan of follow-up after her sleep study.  I answered all her questions today and she was in agreement with our plan.  Of note, she is already on a decent dose of ropinirole, I do not recommend increasing it.  We also talked about the possibility of medication induced restless leg symptoms from her citalopram.  Even venlafaxine can worsen restless leg symptoms.  She is advised that increasing ropinirole will put her at risk of augmentation.   Thank you very much for allowing me to participate in the care of this nice patient. If I can be of any further assistance to you please do not hesitate to call me at [phone].  Sincerely,   Huston Foley, MD, PhD

## 2022-11-14 LAB — IRON AND TIBC
Iron Saturation: 35 % (ref 15–55)
Iron: 109 ug/dL (ref 27–139)
Total Iron Binding Capacity: 314 ug/dL (ref 250–450)
UIBC: 205 ug/dL (ref 118–369)

## 2022-11-14 LAB — B12 AND FOLATE PANEL
Folate: >20 ng/mL (ref >3.0)
Vitamin B-12: 1053 pg/mL (ref 232–1245)

## 2022-11-14 LAB — HGB A1C W/O EAG: Hgb A1c MFr Bld: 5.3 % (ref 4.8–5.6)

## 2022-11-14 LAB — FERRITIN: Ferritin: 106 ng/mL (ref 15–150)

## 2022-11-14 LAB — TSH: TSH: 1.6 u[IU]/mL (ref 0.450–4.500)

## 2022-11-28 ENCOUNTER — Telehealth: Payer: Self-pay | Admitting: Neurology

## 2022-11-28 ENCOUNTER — Telehealth: Payer: Self-pay | Admitting: *Deleted

## 2022-11-28 NOTE — Telephone Encounter (Signed)
NPSG- Medicare no auth req   Patient is scheduled at [facility] for 01/09/23 at 8 pm.  Mailed packet to the patient.

## 2022-11-28 NOTE — Telephone Encounter (Signed)
I called pt and relayed that received message back that she had not read her lab result message that her labs were normal.  She said she had received a phone call.  (Someone left message), so she was aware.  Appreciated follow up.

## 2023-01-02 ENCOUNTER — Other Ambulatory Visit: Payer: Self-pay | Admitting: Family Medicine

## 2023-01-02 DIAGNOSIS — K219 Gastro-esophageal reflux disease without esophagitis: Secondary | ICD-10-CM

## 2023-01-08 ENCOUNTER — Ambulatory Visit (INDEPENDENT_AMBULATORY_CARE_PROVIDER_SITE_OTHER): Payer: Medicare Other | Admitting: Family Medicine

## 2023-01-08 ENCOUNTER — Encounter: Payer: Self-pay | Admitting: Family Medicine

## 2023-01-08 VITALS — BP 122/70 | HR 75 | Temp 98.1°F | Ht 63.0 in | Wt 169.4 lb

## 2023-01-08 DIAGNOSIS — Z23 Encounter for immunization: Secondary | ICD-10-CM

## 2023-01-08 DIAGNOSIS — F411 Generalized anxiety disorder: Secondary | ICD-10-CM

## 2023-01-08 DIAGNOSIS — G4700 Insomnia, unspecified: Secondary | ICD-10-CM

## 2023-01-08 MED ORDER — VENLAFAXINE HCL ER 75 MG PO CP24: 75.0000 mg | ORAL_CAPSULE | Freq: Every day | ORAL | 3 refills | Status: DC

## 2023-01-08 NOTE — Assessment & Plan Note (Signed)
Improved on venlafaxine. I will increase her dose to 75 mg daily. She did start with behavioral health, but found her co-pay's to be unaffordable. She will consider this again in the future.

## 2023-01-08 NOTE — Assessment & Plan Note (Signed)
Improving with her anxiety control being better.

## 2023-01-08 NOTE — Progress Notes (Signed)
[facility] [address] [place] [address] Dept: [phone] Dept Fax: [phone]  Chronic Care Office Visit  Subjective:    Patient ID: Cheryl Yu, female    DOB: Sep 29, 1945, 77 y.o..   MRN: [id number]  Chief Complaint  Patient presents with   Anxiety    2 month f/u meds.  Sleeping about 4 hours at night. Feels better and knitting more.    History of Present Illness:  Patient is in today for reassessment of chronic medical issues.  Ms. Moffet has a history of generalized anxiety with depressive symptoms. AT her last visit , we switched her from citalopram to venlafaxine. She notes that this has improved her mood. She had felt that the citalopram was ineffective, but she definitely notes a difference with the venlafaxine. She is pleased that she is sleeping 4 hours a night.  Ms. Piccirilli has a history of restless legs. She has seen Dr. Frances Furbish. She did not find that splitting her dose of ropinirole was helpful. With the improvements in her anxiety, she feels he RLS is doing better.    Ms. Haggan has a history of right hip arthritis. She notes it does wake her up at night and causes her difficulty with sleep. She does take naproxen 220 mg 2 tabs daily and it manages her daytime pain okay.  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   Osteoporosis without current pathological fracture 12/07/2020   Gastroesophageal reflux disease without esophagitis 12/07/2020   Generalized anxiety disorder 12/07/2020   Past Surgical History:  Procedure Laterality Date   ABDOMINAL HYSTERECTOMY     Took out Uterus   BREAST  ENHANCEMENT SURGERY  1990   silicone   CHOLECYSTECTOMY  1977   Open   complete bladder reconstruction  2019   TRABECULECTOMY     Family History  Problem Relation Age of Onset   Arthritis Mother    Cancer Mother        Ovarian   Hearing loss Mother    Stroke Mother    COPD Father    Cancer Sister        Pancreatic   Early death Sister    Kidney disease Sister    Arthritis Maternal Grandmother    Heart disease Maternal Grandmother    Cancer Maternal Grandfather    Early death Maternal Grandfather    Depression Daughter    Alcohol abuse Son    Depression Son    Diabetes Son    Sleep apnea Neg Hx    Outpatient Medications Prior to Visit  Medication Sig Dispense Refill   ASPIRIN 81 PO Take 325 mg by mouth daily.     cholecalciferol (VITAMIN D3) 25 MCG (1000 UNIT) tablet Take 1,000 Units by mouth daily.     Multiple Vitamin (MULTIVITAMIN) tablet Take 1 tablet by mouth daily.     omeprazole (PRILOSEC) 20 MG capsule TAKE 1 CAPSULE BY MOUTH EVERY DAY 90 capsule 3   rOPINIRole (REQUIP) 2 MG tablet Take 1 tablet (2 mg total) by mouth at bedtime. 30 tablet 11   VENTOLIN HFA 108 (90 Base) MCG/ACT inhaler TAKE 2 PUFFS BY MOUTH EVERY 6 HOURS AS NEEDED FOR WHEEZE  OR SHORTNESS OF BREATH 18 each 1   venlafaxine XR (EFFEXOR XR) 37.5 MG 24 hr capsule Take 1 capsule (37.5 mg total) by mouth daily with breakfast. 30 capsule 5   citalopram (CELEXA) 10 MG tablet Take 1 tablet (10 mg total) by mouth daily. Take as directed to complete taper off of medication. 7 tablet 0   citalopram (CELEXA) 40 MG tablet Take 0.5 tablets (20 mg total) by mouth daily for 7 days. Tale as directed to taper off medication.     No facility-administered medications prior to visit.   Allergies  Allergen Reactions   Codeine Other (See Comments)   Morphine Other (See Comments)   Objective:   Today's Vitals   01/08/23 1011  BP: 122/70  Pulse: 75  Temp: 98.1 F (36.7 C)  TempSrc: Temporal  SpO2: 100%  Weight:  169 lb 6.4 oz (76.8 kg)  Height: 5\' 3"  (1.6 m)   Body mass index is 30.01 kg/m.   General: Well developed, well nourished. No acute distress. Psych: Alert and oriented. Normal mood and affect.  Health Maintenance Due  Topic Date Due   INFLUENZA VACCINE  08/31/2022     Assessment & Plan:   Problem List Items Addressed This Visit       Other   Generalized anxiety disorder - Primary    Improved on venlafaxine. I will increase her dose to 75 mg daily. She did start with behavioral health, but found her co-pay's to be unaffordable. She will consider this again in the future.      Relevant Medications   venlafaxine XR (EFFEXOR XR) 75 MG 24 hr capsule   Insomnia    Improving with her anxiety control being better.      Other Visit Diagnoses     Need for immunization against influenza       Relevant Orders   Flu Vaccine Trivalent High Dose (Fluad) (Completed)       Return in about 3 months (around 04/08/2023) for Reassessment.   Loyola Mast, MD

## 2023-01-09 ENCOUNTER — Ambulatory Visit (INDEPENDENT_AMBULATORY_CARE_PROVIDER_SITE_OTHER): Payer: Medicare Other | Admitting: Neurology

## 2023-01-09 DIAGNOSIS — G4720 Circadian rhythm sleep disorder, unspecified type: Secondary | ICD-10-CM

## 2023-01-09 DIAGNOSIS — G2581 Restless legs syndrome: Secondary | ICD-10-CM

## 2023-01-09 DIAGNOSIS — R519 Headache, unspecified: Secondary | ICD-10-CM

## 2023-01-09 DIAGNOSIS — R9431 Abnormal electrocardiogram [ECG] [EKG]: Secondary | ICD-10-CM

## 2023-01-09 DIAGNOSIS — G4739 Other sleep apnea: Secondary | ICD-10-CM

## 2023-01-09 DIAGNOSIS — Z9189 Other specified personal risk factors, not elsewhere classified: Secondary | ICD-10-CM

## 2023-01-09 DIAGNOSIS — E611 Iron deficiency: Secondary | ICD-10-CM

## 2023-01-09 DIAGNOSIS — G4733 Obstructive sleep apnea (adult) (pediatric): Secondary | ICD-10-CM

## 2023-01-09 DIAGNOSIS — R351 Nocturia: Secondary | ICD-10-CM

## 2023-01-09 DIAGNOSIS — R0683 Snoring: Secondary | ICD-10-CM

## 2023-01-09 DIAGNOSIS — D508 Other iron deficiency anemias: Secondary | ICD-10-CM

## 2023-01-10 NOTE — Procedures (Signed)
Physician Interpretation:     [facility] SPLIT NIGHT INTERPRETATION REPORT   STUDY DATE: 01/09/2023     PATIENT NAME:  Cheryl Yu, Cheryl Yu         DATE OF BIRTH:  Sep 25, 1945  PATIENT ID:  [id number]    TYPE OF STUDY:  PSG  READING PHYSICIAN: Huston Foley, MD, PhD SCORING TECHNICIAN: Domingo Cocking     Referred by: Loyola Mast, MD  ? History and Indication for Testing: 77 year old female with an underlying medical history of allergies, arthritis, reflux disease, history of syncope, anxiety, depression, and borderline obesity, who reports a longstanding history of restless leg symptoms. She has a history of iron deficiency and anemia. She reports nocturia and rare morning headaches. Her Epworth sleepiness score is 11 out of 24, fatigue severity score is 59 out of 63. Height: 63.0 in Weight: 167 lb (BMI 29) Neck Size: 14.3 in    Medications: Aspirin, Vitamin D3, Celexa, Multivitamins, Prilosec, Requip, Ventolin HFA, Effexor XR  DESCRIPTION: A sleep technologist was in attendance for the duration of the recording.  Data collection, scoring, video monitoring, and reporting were performed in compliance with the AASM Manual for the Scoring of Sleep and Associated Events; (Hypopnea is scored based on the criteria listed in Section VIII D. 1b in the AASM Manual V2.6 using a 4% oxygen desaturation rule or Hypopnea is scored based on the criteria listed in Section VIII D. 1a in the AASM Manual V2.6 using 3% oxygen desaturation and /or arousal rule).  A physician certified by the American Board of Sleep Medicine reviewed each epoch of the study.  FINDINGS:  Please refer to the attached summary for additional quantitative information.  STUDY DETAILS: Lights off was at 21:52: and lights on 04:24: (392 minutes hours in bed). This study was performed with an initial diagnostic portion followed by positive airway pressure titration.    DIAGNOSTIC ANALYSIS     SLEEP  CONTINUITY AND SLEEP ARCHITECTURE:  The diagnostic portion of the study began at 21:52 and ended at 00:20, for a recording time of 2h 28.106m minutes.  Total sleep time was 124 minutes minutes (0.0% supine;  100.0% lateral;  0.0% prone, 0.0% REM sleep), with a decreased sleep efficiency at 83.8%. Sleep latency was decreased at 2.5 minutes. REM sleep was absent. Arousal index was 67.0 /hr. Of the total sleep time, the percentage of stage N1 sleep was 10.8%, stage N2 sleep was 89.2%, stage N3 sleep was 0.0%, and REM sleep was 0.0%. Wake after sleep onset (WASO) time accounted for 21 minutes with mild sleep fragmentation noted.   AROUSAL (Baseline): There were 134.0 arousals in total, for an arousal index of 64.6 arousals/hour.  Of these, 95.0 were identified as respiratory-related arousals (45.8 /hr), 0 were PLM-related arousals (0.0 /hr), and 45 were non-specific arousals (21.7 /hr)   RESPIRATORY MONITORING:  Based on CMS criteria (using a 4% oxygen desaturation rule for scoring hypopneas), there were 126 apneas (70 obstructive; 46 central; 10 mixed), and 223 hypopneas.  Apnea index was 27.5. Hypopnea index was 83.4. The apnea-hypopnea index was 110.8 overall (0.0 supine; 0.0 REM, 0.0 supine REM). There were 0 respiratory effort-related arousals (RERAs).  The RERA index was 0.0 events/hr. Total respiratory disturbance index (RDI) was 110.8 events/hr. RDI results showed: supine RDI  0.0 /hr; non-supine RDI 110.8 /hr; REM RDI 0.0 /hr, supine REM RDI 0.0 /hr.  Based on AASM criteria (using a 3% oxygen desaturation and /or arousal rule for scoring hypopneas),  there were 126 apneas (70 obstructive; 46 central; 10 mixed), and 223 hypopneas.  Apnea index was 27.5. Hypopnea index was 87.7. The apnea-hypopnea index was 115.2 overall (0.0 supine; 0.0 REM, 0.0 supine REM). There were 0 respiratory effort-related arousals (RERAs). Total respiratory disturbance index (RDI) was 115.2 events/hr. RDI results showed: supine RDI  0.0 /hr; non-supine RDI 115.2 /hr; REM RDI 0.0 /hr, supine REM RDI 0.0 /hr.   Respiratory events were associated with oxyhemoglobin desaturations (nadir 56%) from a normal baseline (mean 95%). Total time spent at, or below 88% was 22.2 minutes, or 17.9%  of total sleep time. Snoring was absent. There were 0.0 occurrences of Cheyne Stokes breathing.     LIMB MOVEMENTS: There were 0 periodic limb movements of sleep (0.0/hr), of which 0 (0.0/hr) were associated with an arousal.     OXIMETRY: Total sleep time spent at, or below 88% was 20.5 minutes, or 16.5% of total sleep time.   BODY POSITION: Duration of total sleep and percent of total sleep in their respective position is as follows: supine 00 minutes minutes (0.0%), non-supine 124.5 minutes (100.0%); right 28 minutes minutes (22.9%), left 96 minutes minutes (77.1%), and prone 00 minutes minutes (0.0%).    Analysis of electrocardiogram activity showed the highest heart rate for the baseline portion of the study was 81.0 beats per minute.  The average heart rate during sleep was 69 bpm, while the highest heart rate for the same period was 77 bpm.      TREATMENT ANALYSIS   SLEEP CONTINUITY AND SLEEP ARCHITECTURE:  The treatment portion of the study began at 00:20 and ended at 04:24, for a recording time of 4h 3.66m minutes.  Total sleep time was 117 minutes minutes (0.9% supine;  99.1% lateral; 0.0% prone, 0.0% REM sleep), with a decreased sleep efficiency at 48.0%. Sleep latency was normal at 6.0 minutes. REM sleep latency was decreased at 0.0 minutes.  Arousal index was 64.1 /hr. Of the total sleep time, the percentage of stage N1 sleep was 46.6%, stage N3 sleep was 0.0%, and REM sleep was 0.0%. Wake after sleep onset (WASO) time accounted for 120 minutes with moderate to severe sleep fragmentation noted.   AROUSAL: There were 100.0 arousals in total, for an arousal index of 51.3 arousals/hour.  Of these, 59.0 were identified as  respiratory-related arousals (30.3 /hr), 0 were PLM-related arousals (0.0 /hr), and 67 were non-specific arousals (34.4 /hr)  RESPIRATORY MONITORING:    While on PAP therapy, based on CMS criteria, the apnea-hypopnea index was 61.0 overall (0.5 supine; 0.0 REM).   While on PAP therapy, based on AASM criteria, the apnea-hypopnea index was 65.1 overall (0.5 supine; 0.0 REM).   Respiratory events were associated with oxyhemoglobin desaturation (nadir 83.0%) from a mean of 96.0%.  Total time spent at, or below 88% was 7.0 minutes, or 5.9%  of total sleep time.  Snoring was absent:  . There were 0.0 occurrences of Cheyne Stokes breathing.  LIMB MOVEMENTS: There were 0 periodic limb movements of sleep (0.0/hr), of which 0 (0.0/hr) were associated with an arousal.   OXIMETRY: Total sleep time spent at, or below 88% was 1.7 minutes, or 1.4% of total sleep time.    BODY POSITION: Duration of total sleep and percent of total sleep in their respective position is as follows: supine 01 minutes minutes (0.9%), non-supine 116.0 minutes (99.1%); right 71 minutes minutes (61.1%), left 44 minutes minutes (38.0%), and prone 00 minutes minutes (0.0%). Total supine REM sleep time  was 00 minutes minutes (0.0% of total REM sleep).   Analysis of electrocardiogram activity showed the highest heart rate for the treatment portion of the study was 83.0 beats per minute.  The average heart rate during sleep was 60 bpm, while the highest heart rate for the same period was 78 bpm.     TITRATION DETAILS (SEE ALSO TABLE AT THE END OF THE REPORT):  The patient qualified for an emergency split study due to severe sleep disordered breathing. The patient was shown several different interfaces and was subsequently fitted with a small Solo nasal cushion interface. The patient was started on CPAP at a pressure of 5 cm of water pressure and gradually titrated to 14 cm, but developed significant central apneas after a pressure of 12 cm,  with EPR of 2. She was switched to BiPAP of 14/10 cm and as a final effort to improve her sleep disordered breathing, a backup rate of 10/min was added. She had a residual AHI of 33/h or higher and the titration was not fully successful. O2 nadir was 85%, with mostly non-supine and only NREM sleep achieved during the titration.      EEG: Review of the EEG showed no abnormal electrical discharges and symmetrical bihemispheric findings.      EKG: The EKG revealed normal sinus rhythm (NSR) with occasional PVCs noted. The average heart rate during sleep was 69 bpm in the baseline portion of the study and 60 bpm, during the treatment portion of the study.    AUDIO/VIDEO REVIEW: The audio and video review did not show any abnormal or unusual behaviors, movements, phonations or vocalizations. The patient took 1 restroom break. Snoring was noted, in the mild range.     POST-STUDY QUESTIONNAIRE: Post study, the patient indicated, that sleep was better than usual.     IMPRESSION:    1. Severe Obstructive Sleep Apnea (OSA) 2. Treatment emergent Central Apneas 3. Dysfunctions associated with sleep stages or arousal from sleep 4. Non-specific abnormal electrocardiogram (EKG)  RECOMMENDATIONS:    1. This patient has severe obstructive sleep apnea. The patient qualified for an emergency split sleep study per AASM standards. The baseline AHI was 110.8/h, and O2 nadir 56%.  The patient responded to PAP therapy and was tried on CPAP of up to 14 cm and switched to BiPAP of 14/10 cm due to central respiratory events. A back up rate of 10/min was added. The study was limited due to time, and poor sleep consolidation, as well as absence of REM sleep. Complete resolution of her sleep disordered breathing was not achieved. I recommend home autoPAP therapy at a pressure of 6 to 15 cm and EPR of 2, via small Solo nasal cushion mask with heated humidity (or mask of choice, sized to fit, EPR as per tolerance). She may  benefit from a designated titration study in the near future. The patient will be advised to be fully compliant with PAP therapy to improve sleep related symptoms and decrease long term cardiovascular risks. Please note, that untreated obstructive sleep apnea may carry additional perioperative morbidity. Patients with significant obstructive sleep apnea should receive perioperative PAP therapy and the surgeons and particularly the anesthesiologist should be informed of the diagnosis and the severity of the sleep disordered breathing. 2. This study shows sleep fragmentation and abnormal sleep stage percentages; these are nonspecific findings and per se do not signify an intrinsic sleep disorder or a cause for the patient's sleep-related symptoms. Causes include (but are not limited to)  the first night effect of the sleep study, circadian rhythm disturbances, medication effect or an underlying mood disorder or medical problem.  3. The study showed occasional PVCs on single lead EKG; clinical correlation is recommended and consultation with cardiology may be feasible.  4. The patient should be cautioned not to drive, work at heights, or operate dangerous or heavy equipment when tired or sleepy. Review and reiteration of good sleep hygiene measures should be pursued with any patient. 5. The patient will be seen in follow-up in the sleep clinic at [facility] for discussion of the test results, symptom and treatment compliance review, further management strategies, etc. The referring provider will be notified of the test results.   I certify that I have reviewed the entire raw data recording prior to the issuance of this report in accordance with the Standards of Accreditation of the American Academy of Sleep Medicine (AASM).  Huston Foley, MD, PhD Medical Director, [facility]) Diplomat, ABPN (Neurology and Sleep)          Technical Report:   [facility] Split Summary    General Information  Name: Cheryl Yu, Cheryl Yu BMI: 58.52 Physician: Huston Foley, MD  ID: [id number] Height: 63.0 in Technician: Domingo Cocking, RPSGT  Sex: Female Weight: 167.0 lb Record: [id number]  Age: 35 [1945/10/17] Date: 01/09/2023     Medical & Medication History    Ms. Cooperwood is a 77 year old female with an underlying medical history of allergies, arthritis, reflux disease, history of syncope, anxiety, depression, and borderline obesity, who reports a longstanding history of restless leg symptoms, most of her adult life. She had symptoms off and on for years. She has been on ropinirole for the past at least 1 year. She has been on citalopram for the past 2 to 3 years and is currently transitioning off of it, she will start Effexor XR soon. She has mostly anxiety, is not bothered by depression, denies any suicidal ideations. She does not wake up rested. She has a history of snoring occasionally. She is not aware of any family history of restless leg syndrome. She does have a history of iron deficiency and anemia, she reports that iron supplements make her constipation, she also has constipation at baseline. Her Epworth sleepiness score is 11 out of 24, fatigue severity score is 59 out of 63. She lives with her daughter, her 13 year old granddaughter, she also takes care of her 50-month-old great granddaughter currently (grandson's child), but this is going to change soon. She is retired, she is widowed and also divorced twice. She quit smoking in 1997, she drinks no alcohol currently, she drinks caffeine in the form of coffee, 1 cup in the morning and 2 soda cans per day. She takes her ropinirole at 6 PM and it helps her go to sleep at night, bedtime is generally around 8 PM and rise time around 5 AM. She has nocturia about once per average night and has had rare morning headaches. She used to have migraines but no longer has them.  Aspirin, Vitamin  D3, Celexa, Multivitamins, Prilosec, Requip, Ventolin HFA, Effexor XR   Sleep Disorder      Comments   Patient arrived for a diagnostic polysomnogram. Procedure explained and all questions answered. Standard paste setup without complications. Patient slept supine and left. Mild snoring heard. Severe respiratory events observed. After 2 hours total sleep time, AHI = 117. CPAP was started at 5cm with a small solo nasal cushion. CPAP  pressure was increased to BiPAP ST 14/10 cm/H2O, BUR = 10, in an effort to control obstructive events and abolish snoring. EPR at 2 was used with CPAP to initiate sleep. No significant cardiac arrhythmias observed. PLMS observed. Patient has a history of restless legs. One restroom visit.   Baseline Sleep Stage Information Baseline start time: 09:52:03 PM Baseline end time: 12:20:37 AM   Time Total Supine Side Prone Upright  Recording 2h 28.9m 0h 0.88m 2h 28.67m 0h 0.44m 0h 0.14m  Sleep 2h 4.79m 0h 0.29m 2h 4.37m 0h 0.45m 0h 0.18m   Latency N1 N2 N3 REM Onset Per. Slp. Eff.  Actual 0h 0.12m 0h 6.66m 0h 0.33m 0h 0.22m 0h 2.28m 0h 6.45m 83.84%   Stg Dur Wake N1 N2 N3 REM  Total 24.0 13.5 111.0 0.0 0.0  Supine 0.0 0.0 0.0 0.0 0.0  Side 24.0 13.5 111.0 0.0 0.0  Prone 0.0 0.0 0.0 0.0 0.0  Upright 0.0 0.0 0.0 0.0 0.0   Stg % Wake N1 N2 N3 REM  Total 16.2 10.8 89.2 0.0 0.0  Supine 0.0 0.0 0.0 0.0 0.0  Side 16.2 10.8 89.2 0.0 0.0  Prone 0.0 0.0 0.0 0.0 0.0  Upright 0.0 0.0 0.0 0.0 0.0    CPAP Sleep Stage Information CPAP start time: 12:20:37 AM CPAP end time: 04:24:12 AM   Time Total Supine Side Prone Upright  Recording (TRT) 4h 3.60m 0h 35.67m 3h 28.77m 0h 0.32m 0h 0.59m  Sleep (TST) 1h 57.64m 0h 1.1m 1h 56.46m 0h 0.71m 0h 0.30m   Latency N1 N2 N3 REM Onset Per. Slp. Eff.  Actual 0h 0.55m 0h 2.81m 0h 0.30m 0h 0.12m 0h 6.53m 0h 6.70m 48.05%   Stg Dur Wake N1 N2 N3 REM  Total 126.5 54.5 62.5 0.0 0.0  Supine 34.0 1.0 0.0 0.0 0.0  Side 92.5 53.5 62.5 0.0 0.0  Prone 0.0 0.0 0.0 0.0 0.0   Upright 0.0 0.0 0.0 0.0 0.0   Stg % Wake N1 N2 N3 REM  Total 52.0 46.6 53.4 0.0 0.0  Supine 14.0 0.9 0.0 0.0 0.0  Side 38.0 45.7 53.4 0.0 0.0  Prone 0.0 0.0 0.0 0.0 0.0  Upright 0.0 0.0 0.0 0.0 0.0    Baseline Respiratory Information Apnea Summary Sub Supine Side Prone Upright  Total 57 Total 57 0 57 0 0    REM 0 0 0 0 0    NREM 57 0 57 0 0  Obs 50 REM 0 0 0 0 0    NREM 50 0 50 0 0  Mix 7 REM 0 0 0 0 0    NREM 7 0 7 0 0  Cen 0 REM 0 0 0 0 0    NREM 0 0 0 0 0   Rera Summary Sub Supine Side Prone Upright  Total 0 Total 0 0 0 0 0    REM 0 0 0 0 0    NREM 0 0 0 0 0   Hypopnea Summary Sub Supine Side Prone Upright  Total 182 Total 182 0 182 0 0    REM 0 0 0 0 0    NREM 182 0 182 0 0   4% Hypopnea Summary Sub Supine Side Prone Upright  Total (4%) 173 Total 173 0 173 0 0    REM 0 0 0 0 0    NREM 173 0 173 0 0     AHI Total Obs Mix Cen  115.18 Apnea 27.47 24.10 3.37 0.00   Hypopnea 87.71 -- -- --  110.84 Hypopnea (4%) 83.37 -- -- --    Total Supine Side Prone Upright  Position AHI 115.18 0.00 115.18 0.00 0.00  REM AHI 0.00   NREM AHI 115.18   Position RDI 115.18 0.00 115.18 0.00 0.00  REM RDI 0.00   NREM RDI 115.18    4% Hypopnea Total Supine Side Prone Upright  Position AHI (4%) 110.84 0.00 110.84 0.00 0.00  REM AHI (4%) 0.00   NREM AHI (4%) 110.84   Position RDI (4%) 110.84 0.00 110.84 0.00 0.00  REM RDI (4%) 0.00   NREM RDI (4%) 110.84    CPAP Respiratory Information Apnea Summary Sub Supine Side Prone Upright  Total 69 Total 69 1 68 0 0    REM 0 0 0 0 0    NREM 69 1 68 0 0  Obs 20 REM 0 0 0 0 0    NREM 20 1 19  0 0  Mix 3 REM 0 0 0 0 0    NREM 3 0 3 0 0  Cen 46 REM 0 0 0 0 0    NREM 46 0 46 0 0   Rera Summary Sub Supine Side Prone Upright  Total 0 Total 0 0 0 0 0    REM 0 0 0 0 0    NREM 0 0 0 0 0   Hypopnea Summary Sub Supine Side Prone Upright  Total 58 Total 58 0 58 0 0    REM 0 0 0 0 0    NREM 58 0 58 0 0   4% Hypopnea Summary Sub Supine  Side Prone Upright  Total (4%) 50 Total 50 0 50 0 0    REM 0 0 0 0 0    NREM 50 0 50 0 0     AHI Total Obs Mix Cen  65.13 Apnea 35.38 10.26 1.54 23.59   Hypopnea 29.74 -- -- --  61.03 Hypopnea (4%) 25.64 -- -- --    Total Supine Side Prone Upright  Position AHI 65.13 60.00 65.17 0.00 0.00  REM AHI 0.00   NREM AHI 65.13   Position RDI 65.13 60.00 65.17 0.00 0.00  REM RDI 0.00   NREM RDI 65.13    4% Hypopnea Total Supine Side Prone Upright  Position AHI (4%) 61.03 60.00 61.03 0.00 0.00  REM AHI (4%) 0.00   NREM AHI (4%) 61.03   Position RDI (4%) 61.03 60.00 61.03 0.00 0.00  REM RDI (4%) 0.00   NREM RDI (4%) 61.03    Desaturation Information (Baseline)  <100% <90% <80% <70% <60% <50% <40%  Supine 0 0 0 0 0 0 0  Side 286 248 0 0 0 0 0  Prone 0 0 0 0 0 0 0  Upright 0 0 0 0 0 0 0  Total 286 248 0 0 0 0 0  Desaturation threshold setting: 3% Minimum desaturation setting: 10 seconds SaO2 nadir: 56% The longest event was a 22 sec obstructive Hypopneawith a minimum SaO2 of 89%. The lowest SaO2 was 82% associated with a 15 sec obstructive Apnea. Awakening/Arousal Information (Baseline) # of Awakenings 7  Wake after sleep onset 21.18m  Wake after persistent sleep 19.67m   Arousal Assoc. Arousals Index  Apneas 31 14.9  Hypopneas 64 30.8  Leg Movements 0 0.0  Snore 0.0 0.0  PTT Arousals 0 0.0  Spontaneous 45 21.7  Total 139 67.0   Desaturation Information (CPAP)  <100% <90% <80% <70% <60% <50% <40%  Supine 31  7 0 0 0 0 0  Side 238 77 0 0 0 0 0  Prone 0 0 0 0 0 0 0  Upright 0 0 0 0 0 0 0  Total 269 84 0 0 0 0 0  Desaturation threshold setting: 3% Minimum desaturation setting: 10 seconds SaO2 nadir: 76% The longest event was a 28 sec obstructive Hypopnea with a minimum SaO2 of 90%. The lowest SaO2 was 50% associated with a 12 sec obstructive Hypopnea. Awakening/Arousal Information (CPAP) # of Awakenings 33  Wake after sleep onset 120.37m  Wake after persistent sleep  120.27m   Arousal Assoc. Arousals Index  Apneas 32 16.4  Hypopneas 27 13.8  Leg Movements 0 0.0  Snore 0.0 0.0  PTT Arousals 0 0.0  Spontaneous 67 34.4  Total 125 64.1     EKG Rates (Baseline) EKG Avg Max Min  Awake 72 81 61  Asleep 69 77 62  EKG Events: N/A Myoclonus Information (Baseline) PLMS LMs Index  Total LMs during PLMS 0 0.0  LMs w/ Microarousals 0 0.0   LM LMs Index  w/ Microarousal 0 0.0  w/ Awakening 0 0.0  w/ Resp Event 0 0.0  Spontaneous 0 0.0  Total 0 0.0   EKG Rates (CPAP) EKG Avg Max Min  Awake 60 83 47  Asleep 60 78 50  EKG Events: N/A Myoclonus Information (CPAP) PLMS LMs Index  Total LMs during PLMS 0 0.0  LMs w/ Microarousals 0 0.0   LM LMs Index  w/ Microarousal 0 0.0  w/ Awakening 0 0.0  w/ Resp Event 0 0.0  Spontaneous 1 0.5  Total 1 0.5      Titration Table:  [facility] CPAP/Bilevel Report    General Information  Name: Cheryl Yu, Cheryl Yu BMI: 29 Physician: Huston Foley, MD  ID: [id number] Height: 63 in Technician: Domingo Cocking  Sex: Female Weight: 167 lb Record: [id number]  Age: 17 [30-Jul-1945] Date: 01/09/2023 Scorer: Domingo Cocking   Recommended Settings IPAP: N/A cmH20 EPAP: N/A cmH2O AHI: N/A AHI (4%): N/A   Pressure IPAP/EPAP 00 05 07 09 10 14 / 10 14 / 10 12   O2 Vol 0.0 0.0 0.0 0.0 0.0 0.0 0.0 0.0  Time TRT 148.41m 14.25m 27.41m 44.59m 19.17m 24.28m 12.57m 80.73m   TST 124.64m 8.55m 16.21m 19.45m 6.36m 17.30m 1.65m 35.65m  Sleep Stage % Wake 16.2 41.4 41.8 55.7 66.7 30.6 87.5 55.9   % REM 0.0 0.0 0.0 0.0 0.0 0.0 0.0 0.0   % N1 10.8 29.4 59.4 25.6 76.9 50.0 33.3 36.6   % N2 89.2 70.6 40.6 74.4 23.1 50.0 66.7 63.4   % N3 0.0 0.0 0.0 0.0 0.0 0.0 0.0 0.0  Respiratory Total Events 239 5 16 13 7 27 3  49   Obs. Apn. 50 0 5 2 0 0 0 13   Mixed Apn. 7 0 0 1 0 0 0 2   Cen. Apn. 0 2 2 2 1 16 2 18    Hypopneas 182 3 9 8 6 11 1 16    AHI 115.18 35.29 60.00 40.00 64.62 95.29 120.00 82.82   Supine AHI  0.00 0.00 0.00 60.00 0.00 0.00 0.00 0.00   Prone AHI 0.00 0.00 0.00 0.00 0.00 0.00 0.00 0.00   Side AHI 115.18 35.29 60.00 38.92 64.62 95.29 120.00 82.82  Respiratory (4%) Hypopneas (4%) 173.00 2.00 9.00 6.00 6.00 11.00 1.00 12.00   AHI (4%) 110.84 28.24 60.00 33.85 64.62 95.29 120.00 76.06   Supine  AHI (4%) 0.00 0.00 0.00 60.00 0.00 0.00 0.00 0.00   Prone AHI (4%) 0.00 0.00 0.00 0.00 0.00 0.00 0.00 0.00   Side AHI (4%) 110.84 28.24 60.00 32.43 64.62 95.29 120.00 76.06  Desat Profile <= 90% 57.46m 1.52m 0.14m 5.33m 2.38m 5.59m 1.36m 10.77m   <= 80% 0.32m 0.28m 0.31m 0.18m 0.37m 0.67m 0.24m 3.47m   <= 70% 0.38m 0.38m 0.27m 0.81m 0.26m 0.76m 0.24m 3.44m   <= 60% 0.3m 0.24m 0.19m 0.6m 0.50m 0.25m 0.44m 3.69m  Arousal Index Apnea 14.9 7.1 3.8 0.0 0.0 35.3 80.0 28.7   Hypopnea 30.8 7.1 15.0 9.2 18.5 28.2 0.0 11.8   LM 0.0 0.0 0.0 0.0 0.0 0.0 0.0 0.0   Spontaneous 21.7 7.1 22.5 9.2 83.1 17.6 80.0 43.9   Pressure IPAP/EPAP 14   O2 Vol 0.0  Time TRT 21.86m   TST 12.66m  Sleep Stage % Wake 40.5   % REM 0.0   % N1 84.0   % N2 16.0   % N3 0.0  Respiratory Total Events 7   Obs. Apn. 0   Mixed Apn. 0   Cen. Apn. 3   Hypopneas 4   AHI 33.60   Supine AHI 0.00   Prone AHI 0.00   Side AHI 33.60  Respiratory (4%) Hypopneas (4%) 3.00   AHI (4%) 28.80   Supine AHI (4%) 0.00   Prone AHI (4%) 0.00   Side AHI (4%) 28.80  Desat Profile <= 90% 0.69m   <= 80% 0.4m   <= 70% 0.54m   <= 60% 0.52m  Arousal Index Apnea 4.8   Hypopnea 9.6   LM 0.0   Spontaneous 72.0

## 2023-01-15 ENCOUNTER — Telehealth: Payer: Self-pay | Admitting: *Deleted

## 2023-01-15 NOTE — Telephone Encounter (Signed)
Zott, Linnell Fulling, Otilio Jefferson, RN; [place], [place] Got It Thank you

## 2023-01-15 NOTE — Telephone Encounter (Signed)
I spoke with the patient and discussed her sleep study results as noted below by Dr Frances Furbish including the EKG findings during study (PVCs). She is amenable to proceeding with AutoPap set up as quick as possible for severe sleep apnea. We discussed the insurance compliance requirements which includes using the machine at least 4 hours at night and also being seen by our office between 30 and 90 days after set up. We scheduled pt for initial follow-up appt on 04/16/23 at 9:00 am arrival 830 (preferred Mondays).  I let her know [facility] should call her within 3-5 business days to discuss the next steps. She thanked me for the call.  Urgent referral sent to [facility]. Sleep study results sent to referring provider.

## 2023-01-15 NOTE — Addendum Note (Signed)
Addended by: Huston Foley on: 01/15/2023 09:23 AM   Modules accepted: Orders

## 2023-01-15 NOTE — Telephone Encounter (Signed)
-----   Message from Huston Foley sent at 01/15/2023  9:23 AM EST ----- Urgent set up requested on PAP therapy, due to severe OSA. 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 Please call and notify patient that the recent sleep study showed severe obstructive sleep apnea (OSA). She had improvement but not complete resolution of her sleep apnea with CPAP therapy. I recommend home autoPAP with a machine for home use. I placed the order in the chart.  Please advise patient that we will need a follow up appointment with either myself or one of our nurse practitioners in about 2-3 months post set-up to check for how they are doing on treatment and how well it's going with the machine in general. Most insurance company require a certain compliance percentage to continue to cover/pay for the machine. Please ask patient to schedule this FU appointment, according to the set-up date, which is the day they receive the machine. Please make sure, the patient understands the importance of keeping this window for the FU appointment, as it is often an Barista and not our rule. Failing to adhere to this may result in losing coverage for sleep apnea treatment, at which point some insurances require repeating the whole process. Plus, monitoring compliance data is usually good feedback for the patient as far as how they are doing, how many hours they are on it, how well the mask fits, etc.  Also remind patient, that any PAP machine or mask issues should be first addressed with the DME company, who provided the machine/supplies.  Please ask if patient has a preference regarding DME company, may depend on the insurance too.  Her EKG showed occasional extra beats, called PVCs, these are typically considered benign, but she can talk to her PCP about it and maybe get a full EKG through his office.   Ultimately, she may need a second sleep study for  titration with CPAP or another machine called BiPAP (she was tried on BiPAP during the study as well). Huston Foley, MD, PhD [facility])

## 2023-02-26 ENCOUNTER — Telehealth: Payer: Self-pay

## 2023-02-26 NOTE — Telephone Encounter (Signed)
Copied from CRM [id number]. Topic: Clinical - Medical Advice >> Feb 26, 2023  9:29 AM Lorin Glass B wrote: Reason for CRM: Patient states the way she is taking Rx rOPINIRole (REQUIP) 2 MG tablet is in the mornings, but she is still experiencing leg shaking throughout the night. She wants to know if there is anything wrong with taking one tablet in the morning and one tablet in the evening before bed. Callback [phone]  Please advise if okay to change the way sh is taking her medication?  she wants to know if there is anything wrong with taking one tablet in the morning and one tablet in the evening before bed.  Thanks. Dm/cma

## 2023-02-26 NOTE — Telephone Encounter (Signed)
Patient notified VIA phone. Dm/cma

## 2023-03-21 ENCOUNTER — Telehealth: Payer: Self-pay

## 2023-03-21 ENCOUNTER — Other Ambulatory Visit: Payer: Self-pay | Admitting: Family Medicine

## 2023-03-21 DIAGNOSIS — G2581 Restless legs syndrome: Secondary | ICD-10-CM

## 2023-03-21 NOTE — Telephone Encounter (Signed)
Rx sent to the pharmacy   Copied from CRM [id number]. Topic: Clinical - Prescription Issue >> Mar 21, 2023  1:23 PM [place] A wrote: Reason for CRM: Patient needs new prescription for rOPINIRole (REQUIP) 2 MG tablet. She said that she takes two pills at night instead of one, which the doctor changed amount. But now pharmacy said it is too soon for a refill  .

## 2023-03-22 ENCOUNTER — Telehealth: Payer: Self-pay

## 2023-03-22 DIAGNOSIS — G2581 Restless legs syndrome: Secondary | ICD-10-CM

## 2023-03-22 MED ORDER — ROPINIROLE HCL 2 MG PO TABS
2.0000 mg | ORAL_TABLET | Freq: Two times a day (BID) | ORAL | 1 refills | Status: DC
Start: 2023-03-22 — End: 2023-10-22

## 2023-03-22 NOTE — Telephone Encounter (Signed)
Copied from CRM [id number]. Topic: General - Other >> Mar 22, 2023 12:26 PM [place] A wrote: Reason for CRM: Patient called to follow-up on refill request  Spoke to patient and resent a new RX for the Requip 2 mg to take BID per provider notes in a communication.  Patient aware and will et [place] now if there is an issue with the pharmacy. Dm/cma

## 2023-03-22 NOTE — Telephone Encounter (Signed)
Copied from CRM [id number]. Topic: General - Other >> Mar 22, 2023 12:26 PM [place] A wrote: Reason for CRM: Patient called to follow-up on refill requst >> Mar 22, 2023  3:54 PM Irine Seal wrote: Patient called to follow-up on refill request for the 2nd time, stated [facility] was supposed to call clinic back to confirm they received it, stated she would call pharmacy to follow up   Called her pharmacy, RX is ready to be picked up.  Then call and advised patient and she will have her grand-daughter pick this up for her. Dm/cma

## 2023-03-22 NOTE — Telephone Encounter (Signed)
Copied from CRM [id number]. Topic: General - Other >> Mar 22, 2023 12:26 PM [place] A wrote: Reason for CRM: Patient called to follow-up on refill requst

## 2023-04-04 ENCOUNTER — Ambulatory Visit (INDEPENDENT_AMBULATORY_CARE_PROVIDER_SITE_OTHER): Admitting: Family Medicine

## 2023-04-04 ENCOUNTER — Ambulatory Visit: Payer: Self-pay | Admitting: Family Medicine

## 2023-04-04 ENCOUNTER — Ambulatory Visit
Admission: RE | Admit: 2023-04-04 | Discharge: 2023-04-04 | Disposition: A | Discharge status: Home / Self Care | Source: Ambulatory Visit | Attending: Family Medicine

## 2023-04-04 VITALS — BP 126/74 | HR 89 | Temp 98.4°F | Wt 169.2 lb

## 2023-04-04 DIAGNOSIS — R059 Cough, unspecified: Secondary | ICD-10-CM | POA: Insufficient documentation

## 2023-04-04 DIAGNOSIS — R0789 Other chest pain: Secondary | ICD-10-CM | POA: Insufficient documentation

## 2023-04-04 DIAGNOSIS — R062 Wheezing: Secondary | ICD-10-CM | POA: Diagnosis not present

## 2023-04-04 LAB — CBC WITH DIFFERENTIAL/PLATELET
Basophils Absolute: 0 10*3/uL (ref 0.0–0.1)
Basophils Relative: 0.3 % (ref 0.0–3.0)
Eosinophils Absolute: 0.1 10*3/uL (ref 0.0–0.7)
Eosinophils Relative: 0.8 % (ref 0.0–5.0)
HCT: 41.6 % (ref 36.0–46.0)
Hemoglobin: 13.6 g/dL (ref 12.0–15.0)
Lymphocytes Relative: 18 % (ref 12.0–46.0)
Lymphs Abs: 2.4 10*3/uL (ref 0.7–4.0)
MCHC: 32.7 g/dL (ref 30.0–36.0)
MCV: 90.6 fl (ref 78.0–100.0)
Monocytes Absolute: 1.7 10*3/uL — ABNORMAL HIGH (ref 0.1–1.0)
Monocytes Relative: 12.3 % — ABNORMAL HIGH (ref 3.0–12.0)
Neutro Abs: 9.3 10*3/uL — ABNORMAL HIGH (ref 1.4–7.7)
Neutrophils Relative %: 68.6 % (ref 43.0–77.0)
Platelets: 246 10*3/uL (ref 150.0–400.0)
RBC: 4.59 Mil/uL (ref 3.87–5.11)
RDW: 13.5 % (ref 11.5–15.5)
WBC: 13.5 10*3/uL — ABNORMAL HIGH (ref 4.0–10.5)

## 2023-04-04 LAB — COMPREHENSIVE METABOLIC PANEL
ALT: 29 U/L (ref 0–35)
AST: 22 U/L (ref 0–37)
Albumin: 4.2 g/dL (ref 3.5–5.2)
Alkaline Phosphatase: 121 U/L — ABNORMAL HIGH (ref 39–117)
BUN: 17 mg/dL (ref 6–23)
CO2: 26 meq/L (ref 19–32)
Calcium: 9.8 mg/dL (ref 8.4–10.5)
Chloride: 101 meq/L (ref 96–112)
Creatinine, Ser: 0.72 mg/dL (ref 0.40–1.20)
GFR: 80.22 mL/min (ref >60.00)
Glucose, Bld: 112 mg/dL — ABNORMAL HIGH (ref 70–99)
Potassium: 3.6 meq/L (ref 3.5–5.1)
Sodium: 137 meq/L (ref 135–145)
Total Bilirubin: 0.6 mg/dL (ref 0.2–1.2)
Total Protein: 7.5 g/dL (ref 6.0–8.3)

## 2023-04-04 LAB — POCT INFLUENZA A/B
Influenza A, POC: NEGATIVE
Influenza B, POC: NEGATIVE

## 2023-04-04 LAB — C-REACTIVE PROTEIN: CRP: 2.8 mg/dL (ref 0.5–20.0)

## 2023-04-04 LAB — POC COVID19 BINAXNOW: SARS Coronavirus 2 Ag: NEGATIVE

## 2023-04-04 MED ORDER — IPRATROPIUM-ALBUTEROL 0.5-2.5 (3) MG/3ML IN SOLN
3.0000 mL | Freq: Once | RESPIRATORY_TRACT | Status: AC
Start: 2023-04-04 — End: 2023-04-04
  Administered 2023-04-04: 3 mL via RESPIRATORY_TRACT

## 2023-04-04 MED ORDER — ALBUTEROL SULFATE HFA 108 (90 BASE) MCG/ACT IN AERS
2.0000 | INHALATION_SPRAY | RESPIRATORY_TRACT | 1 refills | Status: AC | PRN
Start: 2023-04-04 — End: ?

## 2023-04-04 NOTE — Telephone Encounter (Signed)
 Patient seeing Dr Janee Morn today at 1:00 pm. Dm/cma

## 2023-04-04 NOTE — Telephone Encounter (Signed)
  Chief Complaint: Cough, general illness Symptoms: cough, fatigue, shortness of breath Frequency: 2 weeks Pertinent Negatives: Patient denies fever Disposition: [] ED /[] Urgent Care (no appt availability in office) / [x] Appointment(In office/virtual)/ []  [facility]/ [] Home Care/ [] Refused Recommended Disposition /[] [facility]/ []  Follow-up with PCP Additional Notes: patient called with complaints of illness for at least 2 weeks since returning from [place]. Patient endorses cough that isn't productive, fatigue and some shortness of breath. Patient denies fever. Per protocol, recommendation is for patient to be seen today. Acute appointment made for today at 1:00 pm with another provider in PCP office. Patient verbalized understanding of plan and all questions answered.    Copied from CRM [id number]. Topic: Clinical - Red Word Triage >> Apr 04, 2023 10:10 AM Melissa C wrote: [facility] that prompted transfer to Nurse Triage: patient has been very sick for over two weeks since she came back from [place]. Not sure if she has had a fever as she usually is very low. Afraid she may have pneumonia as this carried as Covid at first, she felt better for one day and now she is terribly sick. Reason for Disposition  [1] MILD difficulty breathing (e.g., minimal/no SOB at rest, SOB with walking, pulse <100) AND [2] still present when not coughing  Answer Assessment - Initial Assessment Questions 1. ONSET: "When did the cough begin?"      2 weeks ago 2. SEVERITY: "How bad is the cough today?"      9 out of 10 in severity 3. SPUTUM: "Describe the color of your sputum" (none, dry cough; clear, white, yellow, green)     dry 4. HEMOPTYSIS: "Are you coughing up any blood?" If so ask: "How much?" (flecks, streaks, tablespoons, etc.)     no 5. DIFFICULTY BREATHING: "Are you having difficulty breathing?" If Yes, ask: "How bad is it?" (e.g., mild, moderate, severe)    - MILD: No SOB at rest, mild  SOB with walking, speaks normally in sentences, can lie down, no retractions, pulse < 100.    - MODERATE: SOB at rest, SOB with minimal exertion and prefers to sit, cannot lie down flat, speaks in phrases, mild retractions, audible wheezing, pulse 100-120.    - SEVERE: Very SOB at rest, speaks in single words, struggling to breathe, sitting hunched forward, retractions, pulse > 120      Mild 6. FEVER: "Do you have a fever?" If Yes, ask: "What is your temperature, how was it measured, and when did it start?"     No 7. CARDIAC HISTORY: "Do you have any history of heart disease?" (e.g., heart attack, congestive heart failure)      No 8. LUNG HISTORY: "Do you have any history of lung disease?"  (e.g., pulmonary embolus, asthma, emphysema)     No 9. PE RISK FACTORS: "Do you have a history of blood clots?" (or: recent major surgery, recent prolonged travel, bedridden)     No 10. OTHER SYMPTOMS: "Do you have any other symptoms?" (e.g., runny nose, wheezing, chest pain)       Chest pressure from coughing 12. TRAVEL: "Have you traveled out of the country in the last month?" (e.g., travel history, exposures)       Traveled from [place] two weeks ago  Protocols used: Cough - Acute Non-Productive-A-AH

## 2023-04-04 NOTE — Patient Instructions (Signed)
 VISIT SUMMARY:  Today, you were seen for a two-week history of cough, fatigue, and shortness of breath, along with chest pressure. We discussed your symptoms, performed some tests, and planned further investigations to determine the cause and appropriate treatment.  YOUR PLAN:  -COUGH, FATIGUE, AND SHORTNESS OF BREATH: Your symptoms may be due to pneumonia, bronchitis, or a worsening of your existing respiratory condition. We will do a chest x-ray to check for pneumonia and have given you a breathing treatment today. You should use your albuterol inhaler (2 puffs every 4 hours as needed) to help with wheezing or shortness of breath. We also ordered blood tests to check for infection and inflammation. Please follow up in a few days if your symptoms do not improve.  -GENERAL HEALTH MAINTENANCE: Your weight has been stable, which is good for your heart health. However, you mentioned poor dietary habits. It is important to eat a balanced diet and have regular meals. Please monitor your weight regularly.  INSTRUCTIONS:  Please get a chest x-ray as soon as possible to rule out pneumonia. Use your albuterol inhaler as prescribed. We have also ordered blood tests (procalcitonin, CMP, CBC, and CRP) to check for infection and inflammation. Follow up in a few days if your symptoms do not improve.  For more information, you can read your full clinical note, available in your patient portal.

## 2023-04-04 NOTE — Progress Notes (Signed)
 Assessment/Plan:      Cough, Fatigue, and Shortness of Breath Cheryl Yu presents with a two-week history of cough, fatigue, and shortness of breath, accompanied by chest pressure and mild crackles in the right lower lung field. Differential diagnosis includes pneumonia, bronchitis, or exacerbation of chronic respiratory conditions. COVID-19 and influenza tests were negative. EKG showed normal sinus rhythm. Discussed the need for a chest x-ray to confirm pneumonia and potential antibiotic treatment. Explained the use of an albuterol inhaler for symptom relief and the importance of follow-up if symptoms do not improve. - Order chest x-ray to rule out pneumonia - Administer DuoNeb (ipratropium and albuterol) breathing treatment.  Reports improvement in symptoms. - Prescribe albuterol inhaler, 2 puffs every 4 hours as needed for wheezing or shortness of breath - Order procalcitonin, CMP, CBC, and CRP to assess for infection and inflammation - Follow up in a few days if symptoms do not improve  General Health Maintenance Cheryl Yu's weight has been stable at 169 lbs over the past three months. She reports poor dietary habits. No rapid weight gain noted, which is important from a cardiac perspective. - Encourage balanced diet and regular meals - Monitor weight regularly.        Medications Discontinued During This Encounter  Medication Reason   VENTOLIN HFA 108 (90 Base) MCG/ACT inhaler Reorder    Return in about 2 days (around 04/06/2023), or if symptoms worsen or fail to improve.    Subjective:   Encounter date: 04/04/2023   History of Present Illness   Cheryl Chanda "Cheryl Yu" is a 78 year old female who presents with two weeks of cough, fatigue, and shortness of breath.  She has been experiencing a cough, fatigue, and shortness of breath for the past two weeks. These symptoms are more pronounced at night, possibly due to lying down. She is unsure if the symptoms are worsening but notes she has not  improved.  She describes a sensation of 'something's sitting on my chest' with chest pressure located in the middle of her chest, radiating to her back.  She has a past medical history of pneumonia six years ago, which required hospitalization for four days. Her current symptoms feel similar to her previous pneumonia experience. She also reports a history of breathing problems, for which she was previously seen by another doctor.  She has not been using any specific medications for her symptoms except for Nyquil. She is supposed to use a CPAP machine but is unable to due to her cough. She has an albuterol inhaler but does not use it often, although she acknowledges she might need to use it more frequently. She is unsure if the inhaler is still effective as she has had it for a while.  No leg swelling and no significant fever, although she feels warm and believes she has an infection. Her temperature has not reached 100F, and she notes that her normal temperature is below 98.60F. No significant weight changes, with her weight stable at 169 pounds over the past three months.  In terms of her social history, she mentions not eating much and consuming 'the wrong things.' She had not eaten on the day of the visit except for a protein drink in the morning.       Review of Systems  All other systems reviewed and are negative.   Past Surgical History:  Procedure Laterality Date   ABDOMINAL HYSTERECTOMY     Took out Uterus   BREAST ENHANCEMENT SURGERY  1990   silicone  CHOLECYSTECTOMY  1977   Open   complete bladder reconstruction  2019   TRABECULECTOMY      Outpatient Medications Prior to Visit  Medication Sig Dispense Refill   ASPIRIN 81 PO Take 325 mg by mouth daily.     cholecalciferol (VITAMIN D3) 25 MCG (1000 UNIT) tablet Take 1,000 Units by mouth daily.     Multiple Vitamin (MULTIVITAMIN) tablet Take 1 tablet by mouth daily.     omeprazole (PRILOSEC) 20 MG capsule TAKE 1 CAPSULE BY  MOUTH EVERY DAY 90 capsule 3   rOPINIRole (REQUIP) 2 MG tablet Take 1 tablet (2 mg total) by mouth in the morning and at bedtime. 180 tablet 1   venlafaxine XR (EFFEXOR XR) 75 MG 24 hr capsule Take 1 capsule (75 mg total) by mouth daily with breakfast. 90 capsule 3   VENTOLIN HFA 108 (90 Base) MCG/ACT inhaler TAKE 2 PUFFS BY MOUTH EVERY 6 HOURS AS NEEDED FOR WHEEZE OR SHORTNESS OF BREATH 18 each 1   No facility-administered medications prior to visit.    Family History  Problem Relation Age of Onset   Arthritis Mother    Cancer Mother        Ovarian   Hearing loss Mother    Stroke Mother    COPD Father    Cancer Sister        Pancreatic   Early death Sister    Kidney disease Sister    Arthritis Maternal Grandmother    Heart disease Maternal Grandmother    Cancer Maternal Grandfather    Early death Maternal Grandfather    Depression Daughter    Alcohol abuse Son    Depression Son    Diabetes Son    Sleep apnea Neg Hx     Social History   Socioeconomic History   Marital status: Divorced    Spouse name: Not on file   Number of children: 3   Years of education: Not on file   Highest education level: 12th grade  Occupational History   Occupation: Retired  Tobacco Use   Smoking status: Former    Current packs/day: 0.00    Types: Cigarettes    Quit date: 2000    Years since quitting: 25.1   Smokeless tobacco: Never  Vaping Use   Vaping status: Never Used  Substance and Sexual Activity   Alcohol use: Not Currently    Comment: 1 glass wine daily   Drug use: Never   Sexual activity: Yes  Other Topics Concern   Not on file  Social History Narrative   Not on file   Social Drivers of Health   Financial Resource Strain: High Risk (01/05/2023)   Overall Financial Resource Strain (CARDIA)    Difficulty of Paying Living Expenses: Hard  Food Insecurity: Food Insecurity Present (01/05/2023)   Hunger Vital Sign    Worried About Running Out of Food in the Last Year: Never  true    Ran Out of Food in the Last Year: Sometimes true  Transportation Needs: No Transportation Needs (01/05/2023)   PRAPARE - Administrator, Civil Service (Medical): No    Lack of Transportation (Non-Medical): No  Physical Activity: Unknown (01/05/2023)   Exercise Vital Sign    Days of Exercise per Week: 0 days    Minutes of Exercise per Session: Patient declined  Stress: Stress Concern Present (01/05/2023)   [employer] of Occupational Health - Occupational Stress Questionnaire    Feeling of Stress : To some extent  Social Connections: Socially Isolated (01/05/2023)   Social Connection and Isolation Panel [NHANES]    Frequency of Communication with Friends and Family: More than three times a week    Frequency of Social Gatherings with Friends and Family: More than three times a week    Attends Religious Services: Never    Database administrator or Organizations: No    Attends Banker Meetings: Never    Marital Status: Divorced  Catering manager Violence: Not At Risk (04/19/2021)   Humiliation, Afraid, Rape, and Kick questionnaire    Fear of Current or Ex-Partner: No    Emotionally Abused: No    Physically Abused: No    Sexually Abused: No                                                                                                  Objective:  Physical Exam: BP 126/74   Pulse 89   Temp 98.4 F (36.9 C) (Oral)   Wt 169 lb 3.2 oz (76.7 kg)   SpO2 97%   BMI 29.97 kg/m    Wt Readings from Last 3 Encounters:  04/04/23 169 lb 3.2 oz (76.7 kg)  01/08/23 169 lb 6.4 oz (76.8 kg)  11/13/22 167 lb (75.8 kg)   Physical Exam Constitutional:      General: She is not in acute distress.    Appearance: Normal appearance. She is not ill-appearing or toxic-appearing.  HENT:     Head: Normocephalic and atraumatic.     Nose: Nose normal. No congestion.  Eyes:     General: No scleral icterus.    Extraocular Movements: Extraocular movements intact.   Cardiovascular:     Rate and Rhythm: Normal rate and regular rhythm.     Pulses: Normal pulses.     Heart sounds: Normal heart sounds.  Pulmonary:     Effort: No tachypnea, bradypnea, accessory muscle usage, respiratory distress or retractions.     Breath sounds: Examination of the right-lower field reveals rhonchi. Rhonchi present.  Abdominal:     General: Abdomen is flat. Bowel sounds are normal.     Palpations: Abdomen is soft.  Musculoskeletal:        General: Normal range of motion.     Right lower leg: No edema.     Left lower leg: No edema.  Lymphadenopathy:     Cervical: No cervical adenopathy.  Skin:    General: Skin is warm and dry.     Findings: No rash.  Neurological:     General: No focal deficit present.     Mental Status: She is alert and oriented to person, place, and time. Mental status is at baseline.  Psychiatric:        Mood and Affect: Mood normal.        Behavior: Behavior normal.        Thought Content: Thought content normal.        Judgment: Judgment normal.    04/04/2023: ECG: NSR without st changes  Nocturnal polysomnography Result Date: 01/09/2023 Huston Foley, MD     01/15/2023  9:13 AM  Physician Interpretation:  [facility] SPLIT NIGHT INTERPRETATION REPORT STUDY DATE: 01/09/2023  PATIENT NAME:  Cheryl Yu, Cheryl Yu        DATE OF BIRTH:  December 30, 1945 PATIENT ID:  [id number]    TYPE OF STUDY:  PSG READING PHYSICIAN: Huston Foley, MD, PhD SCORING TECHNICIAN: Domingo Cocking  Referred by: Loyola Mast, MD ? History and Indication for Testing: 78 year old female with an underlying medical history of allergies, arthritis, reflux disease, history of syncope, anxiety, depression, and borderline obesity, who reports a longstanding history of restless leg symptoms. She has a history of iron deficiency and anemia. She reports nocturia and rare morning headaches. Her Epworth sleepiness score is 11 out of 24, fatigue severity score is 59  out of 63. Height: 63.0 in Weight: 167 lb (BMI 29) Neck Size: 14.3 in  Medications: Aspirin, Vitamin D3, Celexa, Multivitamins, Prilosec, Requip, Ventolin HFA, Effexor XR DESCRIPTION: A sleep technologist was in attendance for the duration of the recording.  Data collection, scoring, video monitoring, and reporting were performed in compliance with the AASM Manual for the Scoring of Sleep and Associated Events; (Hypopnea is scored based on the criteria listed in Section VIII D. 1b in the AASM Manual V2.6 using a 4% oxygen desaturation rule or Hypopnea is scored based on the criteria listed in Section VIII D. 1a in the AASM Manual V2.6 using 3% oxygen desaturation and /or arousal rule).  A physician certified by the American Board of Sleep Medicine reviewed each epoch of the study. FINDINGS:  Please refer to the attached summary for additional quantitative information. STUDY DETAILS: Lights off was at 21:52: and lights on 04:24: (392 minutes hours in bed). This study was performed with an initial diagnostic portion followed by positive airway pressure titration.  DIAGNOSTIC ANALYSIS   SLEEP CONTINUITY AND SLEEP ARCHITECTURE:  The diagnostic portion of the study began at 21:52 and ended at 00:20, for a recording time of 2h 28.26m minutes.  Total sleep time was 124 minutes minutes (0.0% supine;  100.0% lateral;  0.0% prone, 0.0% REM sleep), with a decreased sleep efficiency at 83.8%. Sleep latency was decreased at 2.5 minutes. REM sleep was absent. Arousal index was 67.0 /hr. Of the total sleep time, the percentage of stage N1 sleep was 10.8%, stage N2 sleep was 89.2%, stage N3 sleep was 0.0%, and REM sleep was 0.0%. Wake after sleep onset (WASO) time accounted for 21 minutes with mild sleep fragmentation noted.  AROUSAL (Baseline): There were 134.0 arousals in total, for an arousal index of 64.6 arousals/hour.  Of these, 95.0 were identified as respiratory-related arousals (45.8 /hr), 0 were PLM-related arousals (0.0  /hr), and 45 were non-specific arousals (21.7 /hr)  RESPIRATORY MONITORING:  Based on CMS criteria (using a 4% oxygen desaturation rule for scoring hypopneas), there were 126 apneas (70 obstructive; 46 central; 10 mixed), and 223 hypopneas.  Apnea index was 27.5. Hypopnea index was 83.4. The apnea-hypopnea index was 110.8 overall (0.0 supine; 0.0 REM, 0.0 supine REM). There were 0 respiratory effort-related arousals (RERAs).  The RERA index was 0.0 events/hr. Total respiratory disturbance index (RDI) was 110.8 events/hr. RDI results showed: supine RDI  0.0 /hr; non-supine RDI 110.8 /hr; REM RDI 0.0 /hr, supine REM RDI 0.0 /hr. Based on AASM criteria (using a 3% oxygen desaturation and /or arousal rule for scoring hypopneas), there were 126 apneas (70 obstructive; 46 central; 10 mixed), and 223 hypopneas.  Apnea index was 27.5. Hypopnea index was 87.7. The apnea-hypopnea index was  115.2 overall (0.0 supine; 0.0 REM, 0.0 supine REM). There were 0 respiratory effort-related arousals (RERAs). Total respiratory disturbance index (RDI) was 115.2 events/hr. RDI results showed: supine RDI 0.0 /hr; non-supine RDI 115.2 /hr; REM RDI 0.0 /hr, supine REM RDI 0.0 /hr.  Respiratory events were associated with oxyhemoglobin desaturations (nadir 56%) from a normal baseline (mean 95%). Total time spent at, or below 88% was 22.2 minutes, or 17.9%  of total sleep time. Snoring was absent. There were 0.0 occurrences of Cheyne Stokes breathing.   LIMB MOVEMENTS: There were 0 periodic limb movements of sleep (0.0/hr), of which 0 (0.0/hr) were associated with an arousal.   OXIMETRY: Total sleep time spent at, or below 88% was 20.5 minutes, or 16.5% of total sleep time.  BODY POSITION: Duration of total sleep and percent of total sleep in their respective position is as follows: supine 00 minutes minutes (0.0%), non-supine 124.5 minutes (100.0%); right 28 minutes minutes (22.9%), left 96 minutes minutes (77.1%), and prone 00 minutes  minutes (0.0%).  Analysis of electrocardiogram activity showed the highest heart rate for the baseline portion of the study was 81.0 beats per minute.  The average heart rate during sleep was 69 bpm, while the highest heart rate for the same period was 77 bpm.   TREATMENT ANALYSIS  SLEEP CONTINUITY AND SLEEP ARCHITECTURE:  The treatment portion of the study began at 00:20 and ended at 04:24, for a recording time of 4h 3.56m minutes.  Total sleep time was 117 minutes minutes (0.9% supine;  99.1% lateral; 0.0% prone, 0.0% REM sleep), with a decreased sleep efficiency at 48.0%. Sleep latency was normal at 6.0 minutes. REM sleep latency was decreased at 0.0 minutes.  Arousal index was 64.1 /hr. Of the total sleep time, the percentage of stage N1 sleep was 46.6%, stage N3 sleep was 0.0%, and REM sleep was 0.0%. Wake after sleep onset (WASO) time accounted for 120 minutes with moderate to severe sleep fragmentation noted.  AROUSAL: There were 100.0 arousals in total, for an arousal index of 51.3 arousals/hour.  Of these, 59.0 were identified as respiratory-related arousals (30.3 /hr), 0 were PLM-related arousals (0.0 /hr), and 67 were non-specific arousals (34.4 /hr) RESPIRATORY MONITORING:  While on PAP therapy, based on CMS criteria, the apnea-hypopnea index was 61.0 overall (0.5 supine; 0.0 REM).  While on PAP therapy, based on AASM criteria, the apnea-hypopnea index was 65.1 overall (0.5 supine; 0.0 REM).  Respiratory events were associated with oxyhemoglobin desaturation (nadir 83.0%) from a mean of 96.0%.  Total time spent at, or below 88% was 7.0 minutes, or 5.9%  of total sleep time.  Snoring was absent:  . There were 0.0 occurrences of Cheyne Stokes breathing. LIMB MOVEMENTS: There were 0 periodic limb movements of sleep (0.0/hr), of which 0 (0.0/hr) were associated with an arousal.  OXIMETRY: Total sleep time spent at, or below 88% was 1.7 minutes, or 1.4% of total sleep time.  BODY POSITION: Duration of total  sleep and percent of total sleep in their respective position is as follows: supine 01 minutes minutes (0.9%), non-supine 116.0 minutes (99.1%); right 71 minutes minutes (61.1%), left 44 minutes minutes (38.0%), and prone 00 minutes minutes (0.0%). Total supine REM sleep time was 00 minutes minutes (0.0% of total REM sleep).  Analysis of electrocardiogram activity showed the highest heart rate for the treatment portion of the study was 83.0 beats per minute.  The average heart rate during sleep was 60 bpm, while the highest heart rate for the  same period was 78 bpm.   TITRATION DETAILS (SEE ALSO TABLE AT THE END OF THE REPORT): The patient qualified for an emergency split study due to severe sleep disordered breathing. The patient was shown several different interfaces and was subsequently fitted with a small Solo nasal cushion interface. The patient was started on CPAP at a pressure of 5 cm of water pressure and gradually titrated to 14 cm, but developed significant central apneas after a pressure of 12 cm, with EPR of 2. She was switched to BiPAP of 14/10 cm and as a final effort to improve her sleep disordered breathing, a backup rate of 10/min was added. She had a residual AHI of 33/h or higher and the titration was not fully successful. O2 nadir was 85%, with mostly non-supine and only NREM sleep achieved during the titration.   EEG: Review of the EEG showed no abnormal electrical discharges and symmetrical bihemispheric findings.   EKG: The EKG revealed normal sinus rhythm (NSR) with occasional PVCs noted. The average heart rate during sleep was 69 bpm in the baseline portion of the study and 60 bpm, during the treatment portion of the study.  AUDIO/VIDEO REVIEW: The audio and video review did not show any abnormal or unusual behaviors, movements, phonations or vocalizations. The patient took 1 restroom break. Snoring was noted, in the mild range.  POST-STUDY QUESTIONNAIRE: Post study, the patient indicated,  that sleep was better than usual.  IMPRESSION:  1. Severe Obstructive Sleep Apnea (OSA) 2. Treatment emergent Central Apneas 3. Dysfunctions associated with sleep stages or arousal from sleep 4. Non-specific abnormal electrocardiogram (EKG) RECOMMENDATIONS:  1. This patient has severe obstructive sleep apnea. The patient qualified for an emergency split sleep study per AASM standards. The baseline AHI was 110.8/h, and O2 nadir 56%.  The patient responded to PAP therapy and was tried on CPAP of up to 14 cm and switched to BiPAP of 14/10 cm due to central respiratory events. A back up rate of 10/min was added. The study was limited due to time, and poor sleep consolidation, as well as absence of REM sleep. Complete resolution of her sleep disordered breathing was not achieved. I recommend home autoPAP therapy at a pressure of 6 to 15 cm and EPR of 2, via small Solo nasal cushion mask with heated humidity (or mask of choice, sized to fit, EPR as per tolerance). She may benefit from a designated titration study in the near future. The patient will be advised to be fully compliant with PAP therapy to improve sleep related symptoms and decrease long term cardiovascular risks. Please note, that untreated obstructive sleep apnea may carry additional perioperative morbidity. Patients with significant obstructive sleep apnea should receive perioperative PAP therapy and the surgeons and particularly the anesthesiologist should be informed of the diagnosis and the severity of the sleep disordered breathing. 2. This study shows sleep fragmentation and abnormal sleep stage percentages; these are nonspecific findings and per se do not signify an intrinsic sleep disorder or a cause for the patient's sleep-related symptoms. Causes include (but are not limited to) the first night effect of the sleep study, circadian rhythm disturbances, medication effect or an underlying mood disorder or medical problem. 3. The study showed  occasional PVCs on single lead EKG; clinical correlation is recommended and consultation with cardiology may be feasible. 4. The patient should be cautioned not to drive, work at heights, or operate dangerous or heavy equipment when tired or sleepy. Review and reiteration of good sleep hygiene  measures should be pursued with any patient. 5. The patient will be seen in follow-up in the sleep clinic at [facility] for discussion of the test results, symptom and treatment compliance review, further management strategies, etc. The referring provider will be notified of the test results.  I certify that I have reviewed the entire raw data recording prior to the issuance of this report in accordance with the Standards of Accreditation of the American Academy of Sleep Medicine (AASM). Huston Foley, MD, PhD Medical Director, [facility]) Diplomat, ABPN (Neurology and Sleep)  Technical Report: [facility] Split Summary  General Information Name: Cheryl Yu, Cheryl Yu BMI: 13.08 Physician: Huston Foley, MD ID: [id number] Height: 63.0 in Technician: Domingo Cocking, RPSGT Sex: Female Weight: 167.0 lb Record: [id number] Age: 33 [09-04-1945] Date: 01/09/2023    Medical & Medication History   Ms. Loth is a 78 year old female with an underlying medical history of allergies, arthritis, reflux disease, history of syncope, anxiety, depression, and borderline obesity, who reports a longstanding history of restless leg symptoms, most of her adult life. She had symptoms off and on for years. She has been on ropinirole for the past at least 1 year. She has been on citalopram for the past 2 to 3 years and is currently transitioning off of it, she will start Effexor XR soon. She has mostly anxiety, is not bothered by depression, denies any suicidal ideations. She does not wake up rested. She has a history of snoring occasionally. She is not aware of any family history of  restless leg syndrome. She does have a history of iron deficiency and anemia, she reports that iron supplements make her constipation, she also has constipation at baseline. Her Epworth sleepiness score is 11 out of 24, fatigue severity score is 59 out of 63. She lives with her daughter, her 28 year old granddaughter, she also takes care of her 11-month-old great granddaughter currently (grandson's child), but this is going to change soon. She is retired, she is widowed and also divorced twice. She quit smoking in 1997, she drinks no alcohol currently, she drinks caffeine in the form of coffee, 1 cup in the morning and 2 soda cans per day. She takes her ropinirole at 6 PM and it helps her go to sleep at night, bedtime is generally around 8 PM and rise time around 5 AM. She has nocturia about once per average night and has had rare morning headaches. She used to have migraines but no longer has them. Aspirin, Vitamin D3, Celexa, Multivitamins, Prilosec, Requip, Ventolin HFA, Effexor XR  Sleep Disorder    Comments  Patient arrived for a diagnostic polysomnogram. Procedure explained and all questions answered. Standard paste setup without complications. Patient slept supine and left. Mild snoring heard. Severe respiratory events observed. After 2 hours total sleep time, AHI = 117. CPAP was started at 5cm with a small solo nasal cushion. CPAP pressure was increased to BiPAP ST 14/10 cm/H2O, BUR = 10, in an effort to control obstructive events and abolish snoring. EPR at 2 was used with CPAP to initiate sleep. No significant cardiac arrhythmias observed. PLMS observed. Patient has a history of restless legs. One restroom visit.  Baseline Sleep Stage Information Baseline start time: 09:52:03 PM Baseline end time: 12:20:37 AM Time Total Supine Side Prone Upright Recording 2h 28.38m 0h 0.54m 2h 28.66m 0h 0.41m 0h 0.62m Sleep 2h 4.26m 0h 0.42m 2h 4.27m 0h 0.110m 0h 0.41m Latency N1 N2 N3 REM Onset Per. Slp.  Eff. Actual 0h 0.90m 0h 6.12m  0h 0.19m 0h 0.92m 0h 2.64m 0h 6.26m 83.84% Stg Dur Wake N1 N2 N3 REM Total 24.0 13.5 111.0 0.0 0.0 Supine 0.0 0.0 0.0 0.0 0.0 Side 24.0 13.5 111.0 0.0 0.0 Prone 0.0 0.0 0.0 0.0 0.0 Upright 0.0 0.0 0.0 0.0 0.0  Stg % Wake N1 N2 N3 REM Total 16.2 10.8 89.2 0.0 0.0 Supine 0.0 0.0 0.0 0.0 0.0 Side 16.2 10.8 89.2 0.0 0.0 Prone 0.0 0.0 0.0 0.0 0.0 Upright 0.0 0.0 0.0 0.0 0.0  CPAP Sleep Stage Information CPAP start time: 12:20:37 AM CPAP end time: 04:24:12 AM Time Total Supine Side Prone Upright Recording (TRT) 4h 3.50m 0h 35.60m 3h 28.36m 0h 0.30m 0h 0.75m Sleep (TST) 1h 57.57m 0h 1.24m 1h 56.104m 0h 0.60m 0h 0.64m Latency N1 N2 N3 REM Onset Per. Slp. Eff. Actual 0h 0.59m 0h 2.82m 0h 0.65m 0h 0.47m 0h 6.28m 0h 6.76m 48.05% Stg Dur Wake N1 N2 N3 REM Total 126.5 54.5 62.5 0.0 0.0 Supine 34.0 1.0 0.0 0.0 0.0 Side 92.5 53.5 62.5 0.0 0.0 Prone 0.0 0.0 0.0 0.0 0.0 Upright 0.0 0.0 0.0 0.0 0.0  Stg % Wake N1 N2 N3 REM Total 52.0 46.6 53.4 0.0 0.0 Supine 14.0 0.9 0.0 0.0 0.0 Side 38.0 45.7 53.4 0.0 0.0 Prone 0.0 0.0 0.0 0.0 0.0 Upright 0.0 0.0 0.0 0.0 0.0  Baseline Respiratory Information Apnea Summary Sub Supine Side Prone Upright Total 57 Total 57 0 57 0 0   REM 0 0 0 0 0   NREM 57 0 57 0 0 Obs 50 REM 0 0 0 0 0   NREM 50 0 50 0 0 Mix 7 REM 0 0 0 0 0   NREM 7 0 7 0 0 Cen 0 REM 0 0 0 0 0   NREM 0 0 0 0 0 Rera Summary Sub Supine Side Prone Upright Total 0 Total 0 0 0 0 0   REM 0 0 0 0 0   NREM 0 0 0 0 0  Hypopnea Summary Sub Supine Side Prone Upright Total 182 Total 182 0 182 0 0   REM 0 0 0 0 0   NREM 182 0 182 0 0 4% Hypopnea Summary Sub Supine Side Prone Upright Total (4%) 173 Total 173 0 173 0 0   REM 0 0 0 0 0   NREM 173 0 173 0 0  AHI Total Obs Mix Cen 115.18 Apnea 27.47 24.10 3.37 0.00  Hypopnea 87.71 -- -- -- 110.84 Hypopnea (4%) 83.37 -- -- --  Total Supine Side Prone Upright Position AHI 115.18 0.00 115.18 0.00 0.00 REM AHI 0.00  NREM AHI 115.18  Position RDI 115.18 0.00 115.18 0.00 0.00 REM RDI 0.00  NREM RDI 115.18  4% Hypopnea Total  Supine Side Prone Upright Position AHI (4%) 110.84 0.00 110.84 0.00 0.00 REM AHI (4%) 0.00  NREM AHI (4%) 110.84  Position RDI (4%) 110.84 0.00 110.84 0.00 0.00 REM RDI (4%) 0.00  NREM RDI (4%) 110.84  CPAP Respiratory Information Apnea Summary Sub Supine Side Prone Upright Total 69 Total 69 1 68 0 0   REM 0 0 0 0 0   NREM 69 1 68 0 0 Obs 20 REM 0 0 0 0 0   NREM 20 1 19  0 0 Mix 3 REM 0 0 0 0 0   NREM 3 0 3 0 0 Cen 46 REM 0 0 0 0 0   NREM 46 0 46 0 0  Rera Summary Sub Supine Side Prone Upright Total 0 Total 0 0 0 0 0   REM 0 0 0 0 0   NREM 0 0 0 0 0  Hypopnea Summary Sub Supine Side Prone Upright Total 58 Total 58 0 58 0 0   REM 0 0 0 0 0   NREM 58 0 58 0 0 4% Hypopnea Summary Sub Supine Side Prone Upright Total (4%) 50 Total 50 0 50 0 0   REM 0 0 0 0 0   NREM 50 0 50 0 0  AHI Total Obs Mix Cen 65.13 Apnea 35.38 10.26 1.54 23.59  Hypopnea 29.74 -- -- -- 61.03 Hypopnea (4%) 25.64 -- -- --  Total Supine Side Prone Upright Position AHI 65.13 60.00 65.17 0.00 0.00 REM AHI 0.00  NREM AHI 65.13  Position RDI 65.13 60.00 65.17 0.00 0.00 REM RDI 0.00  NREM RDI 65.13  4% Hypopnea Total Supine Side Prone Upright Position AHI (4%) 61.03 60.00 61.03 0.00 0.00 REM AHI (4%) 0.00  NREM AHI (4%) 61.03  Position RDI (4%) 61.03 60.00 61.03 0.00 0.00 REM RDI (4%) 0.00  NREM RDI (4%) 61.03  Desaturation Information (Baseline)  <100% <90% <80% <70% <60% <50% <40% Supine 0 0 0 0 0 0 0 Side 286 248 0 0 0 0 0 Prone 0 0 0 0 0 0 0 Upright 0 0 0 0 0 0 0 Total 286 248 0 0 0 0 0 Desaturation threshold setting: 3% Minimum desaturation setting: 10 seconds SaO2 nadir: 56% The longest event was a 22 sec obstructive Hypopneawith a minimum SaO2 of 89%. The lowest SaO2 was 82% associated with a 15 sec obstructive Apnea. Awakening/Arousal Information (Baseline) # of Awakenings 7 Wake after sleep onset 21.17m Wake after persistent sleep 19.34m Arousal Assoc. Arousals Index Apneas 31 14.9 Hypopneas 64 30.8 Leg Movements 0 0.0 Snore 0.0 0.0 PTT Arousals 0 0.0  Spontaneous 45 21.7 Total 139 67.0  Desaturation Information (CPAP)  <100% <90% <80% <70% <60% <50% <40% Supine 31 7 0 0 0 0 0 Side 238 77 0 0 0 0 0 Prone 0 0 0 0 0 0 0 Upright 0 0 0 0 0 0 0 Total 269 84 0 0 0 0 0 Desaturation threshold setting: 3% Minimum desaturation setting: 10 seconds SaO2 nadir: 76% The longest event was a 28 sec obstructive Hypopnea with a minimum SaO2 of 90%. The lowest SaO2 was 50% associated with a 12 sec obstructive Hypopnea. Awakening/Arousal Information (CPAP) # of Awakenings 33 Wake after sleep onset 120.19m Wake after persistent sleep 120.3m Arousal Assoc. Arousals Index Apneas 32 16.4 Hypopneas 27 13.8 Leg Movements 0 0.0 Snore 0.0 0.0 PTT Arousals 0 0.0 Spontaneous 67 34.4 Total 125 64.1  EKG Rates (Baseline) EKG Avg Max Min Awake 72 81 61 Asleep 69 77 62 EKG Events: N/A Myoclonus Information (Baseline) PLMS LMs Index Total LMs during PLMS 0 0.0 LMs w/ Microarousals 0 0.0 LM LMs Index w/ Microarousal 0 0.0 w/ Awakening 0 0.0 w/ Resp Event 0 0.0 Spontaneous 0 0.0 Total 0 0.0  EKG Rates (CPAP) EKG Avg Max Min Awake 60 83 47 Asleep 60 78 50 EKG Events: N/A Myoclonus Information (CPAP) PLMS LMs Index Total LMs during PLMS 0 0.0 LMs w/ Microarousals 0 0.0 LM LMs Index w/ Microarousal 0 0.0 w/ Awakening 0 0.0 w/ Resp Event 0 0.0 Spontaneous 1 0.5 Total 1 0.5  Titration Table: [facility] CPAP/Bilevel Report  General Information Name:  Cheryl Yu, Cheryl Yu BMI: 29 Physician: Huston Foley, MD ID: [id number] Height: 63 in Technician: Domingo Cocking Sex: Female Weight: 167 lb Record: [id number] Age: 28 [[birth year]] Date: 01/09/2023 Scorer: Domingo Cocking Recommended Settings IPAP: N/A cmH20 EPAP: N/A cmH2O AHI: N/A AHI (4%): N/A Pressure IPAP/EPAP 00 05 07 09 10 14 / 10 14 / 10 12  O2 Vol 0.0 0.0 0.0 0.0 0.0 0.0 0.0 0.0 Time TRT 148.33m 14.22m 27.45m 44.58m 19.32m 24.63m 12.9m 80.66m  TST 124.19m 8.9m 16.80m 19.26m 6.12m 17.50m 1.58m 35.8m Sleep Stage % Wake 16.2 41.4 41.8  55.7 66.7 30.6 87.5 55.9  % REM 0.0 0.0 0.0 0.0 0.0 0.0 0.0 0.0  % N1 10.8 29.4 59.4 25.6 76.9 50.0 33.3 36.6  % N2 89.2 70.6 40.6 74.4 23.1 50.0 66.7 63.4  % N3 0.0 0.0 0.0 0.0 0.0 0.0 0.0 0.0 Respiratory Total Events 239 5 16 13 7 27 3  49  Obs. Apn. 50 0 5 2 0 0 0 13  Mixed Apn. 7 0 0 1 0 0 0 2  Cen. Apn. 0 2 2 2 1 16 2 18   Hypopneas 182 3 9 8 6 11 1 16   AHI 115.18 35.29 60.00 40.00 64.62 95.29 120.00 82.82  Supine AHI 0.00 0.00 0.00 60.00 0.00 0.00 0.00 0.00  Prone AHI 0.00 0.00 0.00 0.00 0.00 0.00 0.00 0.00  Side AHI 115.18 35.29 60.00 38.92 64.62 95.29 120.00 82.82 Respiratory (4%) Hypopneas (4%) 173.00 2.00 9.00 6.00 6.00 11.00 1.00 12.00  AHI (4%) 110.84 28.24 60.00 33.85 64.62 95.29 120.00 76.06  Supine AHI (4%) 0.00 0.00 0.00 60.00 0.00 0.00 0.00 0.00  Prone AHI (4%) 0.00 0.00 0.00 0.00 0.00 0.00 0.00 0.00  Side AHI (4%) 110.84 28.24 60.00 32.43 64.62 95.29 120.00 76.06 Desat Profile <= 90% 57.108m 1.91m 0.14m 5.66m 2.101m 5.95m 1.21m 10.71m  <= 80% 0.24m 0.24m 0.82m 0.20m 0.35m 0.77m 0.75m 3.76m  <= 70% 0.46m 0.65m 0.67m 0.29m 0.66m 0.50m 0.80m 3.85m  <= 60% 0.42m 0.36m 0.59m 0.61m 0.48m 0.35m 0.65m 3.53m Arousal Index Apnea 14.9 7.1 3.8 0.0 0.0 35.3 80.0 28.7  Hypopnea 30.8 7.1 15.0 9.2 18.5 28.2 0.0 11.8  LM 0.0 0.0 0.0 0.0 0.0 0.0 0.0 0.0  Spontaneous 21.7 7.1 22.5 9.2 83.1 17.6 80.0 43.9 Pressure IPAP/EPAP 14  O2 Vol 0.0 Time TRT 21.21m  TST 12.30m Sleep Stage % Wake 40.5  % REM 0.0  % N1 84.0  % N2 16.0  % N3 0.0 Respiratory Total Events 7  Obs. Apn. 0  Mixed Apn. 0  Cen. Apn. 3  Hypopneas 4  AHI 33.60  Supine AHI 0.00  Prone AHI 0.00  Side AHI 33.60 Respiratory (4%) Hypopneas (4%) 3.00  AHI (4%) 28.80  Supine AHI (4%) 0.00  Prone AHI (4%) 0.00  Side AHI (4%) 28.80 Desat Profile <= 90% 0.34m  <= 80% 0.74m  <= 70% 0.21m  <= 60% 0.18m Arousal Index Apnea 4.8  Hypopnea 9.6  LM 0.0  Spontaneous 72.0    Recent Results (from the past 2160 hours)  POCT Influenza A/B     Status: None   Collection Time: 04/04/23  1:26 PM  Result Value Ref  Range   Influenza A, POC Negative Negative   Influenza B, POC Negative Negative  POC COVID-19 BinaxNow     Status: None   Collection Time: 04/04/23  1:26 PM  Result Value Ref Range   SARS Coronavirus 2 Ag Negative Negative        Garner Nash, MD,  MS

## 2023-04-05 ENCOUNTER — Encounter: Payer: Self-pay | Admitting: Family Medicine

## 2023-04-09 ENCOUNTER — Ambulatory Visit: Payer: Medicare Other | Admitting: Family Medicine

## 2023-04-10 LAB — PROCALCITONIN: Procalcitonin: <0.1 ng/mL (ref <0.10)

## 2023-04-11 NOTE — Progress Notes (Deleted)
 PATIENT: Cheryl Yu DOB: [birth year]  REASON FOR VISIT: follow up HISTORY FROM: patient  No chief complaint on file.    HISTORY OF PRESENT ILLNESS:  04/11/23 ALL:  Cheryl Yu is a 78 y.o. female here today for follow up for OSA on CPAP.  She was seen in consult with Dr Frances Furbish 10/2022 for non restorative sleep and history of RLS. Emergency split night study results showed "baseline AHI was 110.8/h, and O2 nadir 56%. The patient responded to PAP therapy and was tried on CPAP of up to 14 cm and switched to BiPAP of 14/10 cm due to central respiratory events. A back up rate of 10/min was added. The study was limited due to time, and poor sleep consolidation, as well as absence of REM sleep. Complete resolution of her sleep disordered breathing was not achieved. I recommend home autoPAP therapy at a pressure of 6 to 15 cm and EPR of 2, via small Solo nasal cushion mask with heated humidity (or mask of choice, sized to fit, EPR as per tolerance)."  She started autoPAP 01/03/2023 through [facility].     HISTORY: (copied from Dr Teofilo Pod previous note)  Dear Dr. Veto Kemps,   I saw your patient, Cheryl Yu, upon your kind request in my sleep clinic today for initial consultation of her sleep disorder, in particular, her restless leg syndrome.  The patient is unaccompanied today.  As you know, Cheryl Yu is a 78 year old female with an underlying medical history of allergies, arthritis, reflux disease, history of syncope, anxiety, depression, and borderline obesity, who reports a longstanding history of restless leg symptoms, most of her adult life.  She had symptoms off and on for years.  She has been on ropinirole for the past at least 1 year.  She has been on citalopram for the past 2 to 3 years and is currently transitioning off of it, she will start Effexor XR soon.  She has mostly anxiety, is not bothered by depression, denies any suicidal ideations.  She does not wake up rested.  She has a  history of snoring occasionally.  She is not aware of any family history of restless leg syndrome.  She does have a history of iron deficiency and anemia, she reports that iron supplements make her constipation, she also has constipation at baseline.  Her Epworth sleepiness score is 11 out of 24, fatigue severity score is 59 out of 63.  She lives with her daughter, her 11 year old granddaughter, she also takes care of her 30-month-old great granddaughter currently (grandson's child), but this is going to change soon.  She is retired, she is widowed and also divorced twice.  She quit smoking in 1997, she drinks no alcohol currently, she drinks caffeine in the form of coffee, 1 cup in the morning and 2 soda cans per day.  She takes her ropinirole at 6 PM and it helps her go to sleep at night, bedtime is generally around 8 PM and rise time around 5 AM.  She has nocturia about once per average night and has had rare morning headaches.  She used to have migraines but no longer has them.  I reviewed your office note from 10/09/2022. She had been on gabapentin for postherpetic neuralgia since December 2023 but recently stopped the medication.  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  She has not seen orthopedics yet.   REVIEW OF SYSTEMS: Out of a complete 14 system review of symptoms, the patient complains only of the following  symptoms, and all other reviewed systems are negative.  ESS:  ALLERGIES: Allergies  Allergen Reactions   Codeine Other (See Comments)   Morphine Other (See Comments)    HOME MEDICATIONS: Outpatient Medications Prior to Visit  Medication Sig Dispense Refill   albuterol (VENTOLIN HFA) 108 (90 Base) MCG/ACT inhaler Inhale 2 puffs into the lungs every 4 (four) hours as needed for wheezing or shortness of breath. 18 each 1   ASPIRIN 81 PO Take 325 mg by mouth daily.     cholecalciferol (VITAMIN D3) 25 MCG (1000 UNIT) tablet Take 1,000 Units by mouth daily.     Multiple Vitamin  (MULTIVITAMIN) tablet Take 1 tablet by mouth daily.     omeprazole (PRILOSEC) 20 MG capsule TAKE 1 CAPSULE BY MOUTH EVERY DAY 90 capsule 3   rOPINIRole (REQUIP) 2 MG tablet Take 1 tablet (2 mg total) by mouth in the morning and at bedtime. 180 tablet 1   venlafaxine XR (EFFEXOR XR) 75 MG 24 hr capsule Take 1 capsule (75 mg total) by mouth daily with breakfast. 90 capsule 3   No facility-administered medications prior to visit.    PAST MEDICAL HISTORY: Past Medical History:  Diagnosis Date   Allergy    Arthritis    Depression    GERD (gastroesophageal reflux disease)    Syncope     PAST SURGICAL HISTORY: Past Surgical History:  Procedure Laterality Date   ABDOMINAL HYSTERECTOMY     Took out Uterus   BREAST ENHANCEMENT SURGERY  1990   silicone   CHOLECYSTECTOMY  1977   Open   complete bladder reconstruction  2019   TRABECULECTOMY      FAMILY HISTORY: Family History  Problem Relation Age of Onset   Arthritis Mother    Cancer Mother        Ovarian   Hearing loss Mother    Stroke Mother    COPD Father    Cancer Sister        Pancreatic   Early death Sister    Kidney disease Sister    Arthritis Maternal Grandmother    Heart disease Maternal Grandmother    Cancer Maternal Grandfather    Early death Maternal Grandfather    Depression Daughter    Alcohol abuse Son    Depression Son    Diabetes Son    Sleep apnea Neg Hx     SOCIAL HISTORY: Social History   Socioeconomic History   Marital status: Divorced    Spouse name: Not on file   Number of children: 3   Years of education: Not on file   Highest education level: 12th grade  Occupational History   Occupation: Retired  Tobacco Use   Smoking status: Former    Current packs/day: 0.00    Types: Cigarettes    Quit date: 2000    Years since quitting: 25.2   Smokeless tobacco: Never  Vaping Use   Vaping status: Never Used  Substance and Sexual Activity   Alcohol use: Not Currently    Comment: 1 glass wine  daily   Drug use: Never   Sexual activity: Yes  Other Topics Concern   Not on file  Social History Narrative   Not on file   Social Drivers of Health   Financial Resource Strain: Medium Risk (04/06/2023)   Overall Financial Resource Strain (CARDIA)    Difficulty of Paying Living Expenses: Somewhat hard  Food Insecurity: Patient Declined (04/06/2023)   Hunger Vital Sign    Worried About  Running Out of Food in the Last Year: Patient declined    Ran Out of Food in the Last Year: Patient declined  Transportation Needs: No Transportation Needs (04/06/2023)   PRAPARE - Administrator, Civil Service (Medical): No    Lack of Transportation (Non-Medical): No  Physical Activity: Unknown (04/06/2023)   Exercise Vital Sign    Days of Exercise per Week: 0 days    Minutes of Exercise per Session: Patient declined  Stress: Stress Concern Present (04/06/2023)   [employer] of Occupational Health - Occupational Stress Questionnaire    Feeling of Stress : To some extent  Social Connections: Socially Isolated (04/06/2023)   Social Connection and Isolation Panel [NHANES]    Frequency of Communication with Friends and Family: More than three times a week    Frequency of Social Gatherings with Friends and Family: More than three times a week    Attends Religious Services: Never    Database administrator or Organizations: No    Attends Banker Meetings: Never    Marital Status: Divorced  Catering manager Violence: Not At Risk (04/19/2021)   Humiliation, Afraid, Rape, and Kick questionnaire    Fear of Current or Ex-Partner: No    Emotionally Abused: No    Physically Abused: No    Sexually Abused: No     PHYSICAL EXAM  There were no vitals filed for this visit. There is no height or weight on file to calculate BMI.  Generalized: Well developed, in no acute distress  Cardiology: normal rate and rhythm, no murmur noted Respiratory: clear to auscultation bilaterally   Neurological examination  Mentation: Alert oriented to time, place, history taking. Follows all commands speech and language fluent Cranial nerve II-XII: Pupils were equal round reactive to light. Extraocular movements were full, visual field were full  Motor: The motor testing reveals 5 over 5 strength of all 4 extremities. Good symmetric motor tone is noted throughout.  Gait and station: Gait is normal.    DIAGNOSTIC DATA (LABS, IMAGING, TESTING) - I reviewed patient records, labs, notes, testing and imaging myself where available.      No data to display           Lab Results  Component Value Date   WBC 13.5 (H) 04/04/2023   HGB 13.6 04/04/2023   HCT 41.6 04/04/2023   MCV 90.6 04/04/2023   PLT 246.0 04/04/2023      Component Value Date/Time   NA 137 04/04/2023 1358   K 3.6 04/04/2023 1358   CL 101 04/04/2023 1358   CO2 26 04/04/2023 1358   GLUCOSE 112 (H) 04/04/2023 1358   BUN 17 04/04/2023 1358   CREATININE 0.72 04/04/2023 1358   CALCIUM 9.8 04/04/2023 1358   PROT 7.5 04/04/2023 1358   ALBUMIN 4.2 04/04/2023 1358   AST 22 04/04/2023 1358   ALT 29 04/04/2023 1358   ALKPHOS 121 (H) 04/04/2023 1358   BILITOT 0.6 04/04/2023 1358   GFRNONAA >60 08/16/2022 1937   Lab Results  Component Value Date   CHOL 212 (H) 09/20/2021   HDL 72.70 09/20/2021   LDLCALC 118 (H) 09/20/2021   TRIG 105.0 09/20/2021   CHOLHDL 3 09/20/2021   Lab Results  Component Value Date   HGBA1C 5.3 11/13/2022   Lab Results  Component Value Date   VITAMINB12 1,053 11/13/2022   Lab Results  Component Value Date   TSH 1.600 11/13/2022     ASSESSMENT AND PLAN 78 y.o. year  old female  has a past medical history of Allergy, Arthritis, Depression, GERD (gastroesophageal reflux disease), and Syncope. here with   No diagnosis found.    Satoya Feeley is doing well on CPAP therapy. Compliance report reveals ***. *** was encouraged to continue using CPAP nightly and for greater than 4  hours each night. We will update supply orders as indicated. Risks of untreated sleep apnea review and education materials provided. Healthy lifestyle habits encouraged. *** will follow up in ***, sooner if needed. *** verbalizes understanding and agreement with this plan.    No orders of the defined types were placed in this encounter.    No orders of the defined types were placed in this encounter.     Shawnie Dapper, FNP-C 04/11/2023, 11:26 AM [facility] [address] [place], [place] [address] [phone]

## 2023-04-12 NOTE — Progress Notes (Deleted)
 Marland Kitchen

## 2023-04-16 ENCOUNTER — Ambulatory Visit: Payer: Medicare Other | Admitting: Family Medicine

## 2023-04-16 ENCOUNTER — Ambulatory Visit: Admitting: Family Medicine

## 2023-04-16 DIAGNOSIS — G4730 Sleep apnea, unspecified: Secondary | ICD-10-CM

## 2023-04-23 ENCOUNTER — Encounter: Payer: Self-pay | Admitting: Family Medicine

## 2023-04-23 ENCOUNTER — Telehealth: Payer: Self-pay | Admitting: Family Medicine

## 2023-04-23 NOTE — Telephone Encounter (Signed)
 04/09/2023 late arrival, pt rescheduled 04/16/2023 same day cancel 20 min prior to appt, family ER  Sent letter via Earleen Reaper

## 2023-04-24 ENCOUNTER — Telehealth: Payer: Self-pay

## 2023-04-24 ENCOUNTER — Telehealth: Payer: Self-pay | Admitting: Family Medicine

## 2023-04-24 NOTE — Telephone Encounter (Signed)
 Copied from CRM [id number]. Topic: General - Other >> Apr 24, 2023 10:49 AM [name] wrote: Reason for CRM: Patient called in stating she had a appointment for March 31 dealing with leg pain and unable to walk, was triaged to the nurses, they stated schedule an appointment within 3 days , patient stated she already had appointment that is 5 days out and would like to keep that appointment

## 2023-04-24 NOTE — Telephone Encounter (Signed)
 Called patient's number to move up the appt per triage note.   Left VM. Dm/cma

## 2023-04-24 NOTE — Telephone Encounter (Signed)
 Pt has appt on 3.31.24 about walking problems and pain in left leg. I reached out to the patient and got her transferred to nurse triage

## 2023-04-24 NOTE — Telephone Encounter (Signed)
 Left VM to rtn call to move appointment up per triage note. Dm/cma

## 2023-04-30 ENCOUNTER — Ambulatory Visit (INDEPENDENT_AMBULATORY_CARE_PROVIDER_SITE_OTHER): Admitting: Family Medicine

## 2023-04-30 VITALS — BP 130/78 | HR 65 | Temp 97.5°F | Ht 63.0 in | Wt 170.0 lb

## 2023-04-30 DIAGNOSIS — M1611 Unilateral primary osteoarthritis, right hip: Secondary | ICD-10-CM | POA: Diagnosis not present

## 2023-04-30 MED ORDER — NAPROXEN 500 MG PO TABS: 500.0000 mg | ORAL_TABLET | Freq: Two times a day (BID) | ORAL | 1 refills | Status: DC

## 2023-04-30 NOTE — Progress Notes (Signed)
 [facility] [address] [place] [address] Dept: [phone] Dept Fax: [phone]  Office Visit  Subjective:    Patient ID: Cheryl Yu, female    DOB: 05/01/1945, 78 y.o..   MRN: [id number]  Chief Complaint  Patient presents with   Leg Pain    C/o having LT leg/hip pain x months and has gotten worse.   Taken Advil.    History of Present Illness:  Patient is in today complaining of a several month history of worsening right hip pain. She has a history of osteoarthritis of the right hip. She notes currently it is uncomfortable both lying down, sitting, or standing for prolonged periods. She uses ibuprofen sparingly, but does take 400 mg when needed. She has a history of phlebitis of the leg, but this seems very different.  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/2021   Osteoporosis without current pathological fracture 12/07/2020   Gastroesophageal reflux disease without esophagitis 12/07/2020   Generalized anxiety disorder 12/07/2020   Past Surgical History:  Procedure Laterality Date   ABDOMINAL HYSTERECTOMY     Took out Uterus   BREAST ENHANCEMENT SURGERY  1990   silicone   CHOLECYSTECTOMY  1977   Open   complete bladder reconstruction  2019   TRABECULECTOMY     Family History  Problem Relation Age of Onset   Arthritis Mother    Cancer Mother        Ovarian   Hearing loss Mother    Stroke Mother    COPD Father    Cancer Sister        Pancreatic   Early death Sister    Kidney disease Sister    Arthritis Maternal Grandmother    Heart disease Maternal  Grandmother    Cancer Maternal Grandfather    Early death Maternal Grandfather    Depression Daughter    Alcohol abuse Son    Depression Son    Diabetes Son    Sleep apnea Neg Hx    Outpatient Medications Prior to Visit  Medication Sig Dispense Refill   albuterol (VENTOLIN HFA) 108 (90 Base) MCG/ACT inhaler Inhale 2 puffs into the lungs every 4 (four) hours as needed for wheezing or shortness of breath. 18 each 1   ASPIRIN 81 PO Take 325 mg by mouth daily.     cholecalciferol (VITAMIN D3) 25 MCG (1000 UNIT) tablet Take 1,000 Units by mouth daily.     Multiple Vitamin (MULTIVITAMIN) tablet Take 1 tablet by mouth daily.     omeprazole (PRILOSEC) 20 MG capsule TAKE 1 CAPSULE BY MOUTH EVERY DAY 90 capsule 3   rOPINIRole (REQUIP) 2 MG tablet Take 1 tablet (2 mg total) by mouth in the morning and at bedtime. 180 tablet 1   venlafaxine XR (EFFEXOR XR) 75 MG 24 hr capsule Take 1 capsule (75 mg total) by mouth daily with breakfast. 90 capsule 3   No facility-administered medications prior to visit.   Allergies  Allergen Reactions   Codeine Other (See Comments)   Morphine Other (See Comments)     Objective:   Today's Vitals   04/30/23 1047  BP:  130/78  Pulse: 65  Temp: (!) 97.5 F (36.4 C)  TempSrc: Temporal  SpO2: 99%  Weight: 170 lb (77.1 kg)  Height: 5\' 3"  (1.6 m)   Body mass index is 30.11 kg/m.   General: Well developed, well nourished. No acute distress. Extremities: Discomfort int he right hip. She is unable to fully extend the leg when lying down. There is   pain with gentle log-rolling of the hip and with FABER. Psych: Alert and oriented. Normal mood and affect.  Health Maintenance Due  Topic Date Due   Medicare Annual Wellness (AWV)  05/01/2023     Assessment & Plan:   Problem List Items Addressed This Visit       Musculoskeletal and Integument   Degenerative arthritis of hip- Right - Primary   [name] has signs of progressive right hip arthritis. I will  refer her to orthopedics for an evaluation. I suspect she may be reaching a point where total hop joint arthroplasty may be necessary. I will have her take naproxen 500 mg up to bid as needed for pain in the meantime.      Relevant Medications   naproxen (NAPROSYN) 500 MG tablet   Other Relevant Orders   Ambulatory referral to Orthopedics    Return if symptoms worsen or fail to improve.   Loyola Mast, MD

## 2023-04-30 NOTE — Assessment & Plan Note (Signed)
 Cheryl Yu has signs of progressive right hip arthritis. I will refer her to orthopedics for an evaluation. I suspect she may be reaching a point where total hop joint arthroplasty may be necessary. I will have her take naproxen 500 mg up to bid as needed for pain in the meantime.

## 2023-05-04 ENCOUNTER — Ambulatory Visit: Payer: Medicare Other

## 2023-05-04 DIAGNOSIS — Z0000 Encounter for general adult medical examination without abnormal findings: Secondary | ICD-10-CM

## 2023-05-04 NOTE — Patient Instructions (Signed)
 Cheryl Yu , Thank you for taking time to come for your Medicare Wellness Visit. I appreciate your ongoing commitment to your health goals. Please review the following plan we discussed and let me know if I can assist you in the future.   Referrals/Orders/Follow-Ups/Clinician Recommendations: none  This is a list of the screening recommended for you and due dates:  Health Maintenance  Topic Date Due   COVID-19 Vaccine (3 - 2024-25 season) 05/16/2023*   Zoster (Shingles) Vaccine (1 of 2) 07/05/2023*   Flu Shot  08/31/2023   Medicare Annual Wellness Visit  05/03/2024   DTaP/Tdap/Td vaccine (2 - Tdap) 01/30/2026   Pneumonia Vaccine  Completed   DEXA scan (bone density measurement)  Completed   Hepatitis C Screening  Completed   HPV Vaccine  Aged Out  *Topic was postponed. The date shown is not the original due date.    Advanced directives: (ACP Link)Information on Advanced Care Planning can be found at [facility] Advance Health Care Directives Advance Health Care Directives. [email or web address]   Next Medicare Annual Wellness Visit scheduled for next year: Yes  insert Preventive Care attachment Insert FALL PREVENTION attachment if needed

## 2023-05-04 NOTE — Progress Notes (Addendum)
 Subjective:   Cheryl Yu is a 78 y.o. who presents for a Medicare Wellness preventive visit.  Visit Complete: Virtual I connected with  Cheryl Yu on 05/04/23 by a audio enabled telemedicine application and verified that I am speaking with the correct person using two identifiers.  Patient Location: Home  Provider Location: Office/Clinic  I discussed the limitations of evaluation and management by telemedicine. The patient expressed understanding and agreed to proceed.  Vital Signs: Because this visit was a virtual/telehealth visit, some criteria may be missing or patient reported. Any vitals not documented were not able to be obtained and vitals that have been documented are patient reported.  VideoError- Librarian, academic were attempted between this provider and patient, however failed, due to patient having technical difficulties OR patient did not have access to video capability.  We continued and completed visit with audio only.   Persons Participating in Visit: Patient.  AWV Questionnaire: No: Patient Medicare AWV questionnaire was not completed prior to this visit.  Cardiac Risk Factors include: advanced age (>34men, >71 women)     Objective:    Today's Vitals   05/04/23 0846  PainSc: 5    There is no height or weight on file to calculate BMI.     05/04/2023    8:51 AM 05/01/2022    8:50 AM 04/19/2021    9:07 AM 03/23/2020    8:23 AM 03/19/2019    2:01 PM  Advanced Directives  Does Patient Have a Medical Advance Directive? No No No Yes No  Does patient want to make changes to medical advance directive?    Yes (MAU/Ambulatory/Procedural Areas - Information given)   Would patient like information on creating a medical advance directive? No - Patient declined  No - Patient declined  No - Patient declined    Current Medications (verified) Outpatient Encounter Medications as of 05/04/2023  Medication Sig   albuterol (VENTOLIN HFA) 108 (90  Base) MCG/ACT inhaler Inhale 2 puffs into the lungs every 4 (four) hours as needed for wheezing or shortness of breath.   ASPIRIN 81 PO Take 325 mg by mouth daily.   cholecalciferol (VITAMIN D3) 25 MCG (1000 UNIT) tablet Take 1,000 Units by mouth daily.   Multiple Vitamin (MULTIVITAMIN) tablet Take 1 tablet by mouth daily.   naproxen (NAPROSYN) 500 MG tablet Take 1 tablet (500 mg total) by mouth 2 (two) times daily with a meal.   omeprazole (PRILOSEC) 20 MG capsule TAKE 1 CAPSULE BY MOUTH EVERY DAY   rOPINIRole (REQUIP) 2 MG tablet Take 1 tablet (2 mg total) by mouth in the morning and at bedtime.   venlafaxine XR (EFFEXOR XR) 75 MG 24 hr capsule Take 1 capsule (75 mg total) by mouth daily with breakfast.   No facility-administered encounter medications on file as of 05/04/2023.    Allergies (verified) Codeine and Morphine   History: Past Medical History:  Diagnosis Date   Allergy    Arthritis    Depression    GERD (gastroesophageal reflux disease)    Syncope    Past Surgical History:  Procedure Laterality Date   ABDOMINAL HYSTERECTOMY     Took out Uterus   BREAST ENHANCEMENT SURGERY  1990   silicone   CHOLECYSTECTOMY  1977   Open   complete bladder reconstruction  2019   TRABECULECTOMY     Family History  Problem Relation Age of Onset   Arthritis Mother    Cancer Mother  Ovarian   Hearing loss Mother    Stroke Mother    COPD Father    Cancer Sister        Pancreatic   Early death Sister    Kidney disease Sister    Arthritis Maternal Grandmother    Heart disease Maternal Grandmother    Cancer Maternal Grandfather    Early death Maternal Grandfather    Depression Daughter    Alcohol abuse Son    Depression Son    Diabetes Son    Sleep apnea Neg Hx    Social History   Socioeconomic History   Marital status: Divorced    Spouse name: Not on file   Number of children: 3   Years of education: Not on file   Highest education level: 12th grade   Occupational History   Occupation: Retired  Tobacco Use   Smoking status: Former    Current packs/day: 0.00    Types: Cigarettes    Quit date: 2000    Years since quitting: 25.2   Smokeless tobacco: Never  Vaping Use   Vaping status: Never Used  Substance and Sexual Activity   Alcohol use: Not Currently    Comment: 1 glass wine daily   Drug use: Never   Sexual activity: Yes  Other Topics Concern   Not on file  Social History Narrative   Not on file   Social Drivers of Health   Financial Resource Strain: Low Risk  (05/04/2023)   Overall Financial Resource Strain (CARDIA)    Difficulty of Paying Living Expenses: Not hard at all  Recent Concern: Financial Resource Strain - Medium Risk (04/06/2023)   Overall Financial Resource Strain (CARDIA)    Difficulty of Paying Living Expenses: Somewhat hard  Food Insecurity: No Food Insecurity (05/04/2023)   Hunger Vital Sign    Worried About Running Out of Food in the Last Year: Never true    Ran Out of Food in the Last Year: Never true  Transportation Needs: No Transportation Needs (05/04/2023)   PRAPARE - Administrator, Civil Service (Medical): No    Lack of Transportation (Non-Medical): No  Physical Activity: Inactive (05/04/2023)   Exercise Vital Sign    Days of Exercise per Week: 0 days    Minutes of Exercise per Session: 0 min  Stress: No Stress Concern Present (05/04/2023)   [employer] of Occupational Health - Occupational Stress Questionnaire    Feeling of Stress : Not at all  Recent Concern: Stress - Stress Concern Present (04/06/2023)   [employer] of Occupational Health - Occupational Stress Questionnaire    Feeling of Stress : To some extent  Social Connections: Socially Isolated (05/04/2023)   Social Connection and Isolation Panel [NHANES]    Frequency of Communication with Friends and Family: More than three times a week    Frequency of Social Gatherings with Friends and Family: More than three times a  week    Attends Religious Services: Never    Database administrator or Organizations: No    Attends Engineer, structural: Never    Marital Status: Divorced    Tobacco Counseling Counseling given: Not Answered    Clinical Intake:  Pre-visit preparation completed: Yes  Pain : 0-10 Pain Score: 5  Pain Type: Chronic pain Pain Location: Hip Pain Orientation: Right Pain Descriptors / Indicators: Aching Pain Onset: More than a month ago Pain Frequency: Constant     Nutritional Risks: None Diabetes: No  Lab Results  Component Value Date   HGBA1C 5.3 11/13/2022     How often do you need to have someone help you when you read instructions, pamphlets, or other written materials from your doctor or pharmacy?: 1 - Never  Interpreter Needed?: No  Information entered by :: NAllen LPN   Activities of Daily Living     05/04/2023    8:47 AM  In your present state of health, do you have any difficulty performing the following activities:  Hearing? 0  Vision? 0  Difficulty concentrating or making decisions? 0  Walking or climbing stairs? 1  Comment due to hip  Dressing or bathing? 0  Doing errands, shopping? 0  Preparing Food and eating ? N  Using the Toilet? N  In the past six months, have you accidently leaked urine? Y  Do you have problems with loss of bowel control? N  Managing your Medications? N  Managing your Finances? N  Housekeeping or managing your Housekeeping? N    Patient Care Team: Loyola Mast, MD as PCP - General (Family Medicine) Huston Foley, MD as Attending Physician (Neurology)  Indicate any recent Medical Services you may have received from other than [facility] providers in the past year (date may be approximate).     Assessment:   This is a routine wellness examination for Cheryl Yu.  Hearing/Vision screen Hearing Screening - Comments:: Denies hearing issues Vision Screening - Comments:: Regular eye exams, [facility]   Goals  Addressed             This Visit's Progress    Patient Stated       05/04/2023, stay healthy       Depression Screen     05/04/2023    8:52 AM 04/30/2023   10:54 AM 08/17/2022    9:13 AM 08/07/2022   10:29 AM 05/01/2022    8:51 AM 02/20/2022   10:04 AM 04/19/2021    9:09 AM  PHQ 2/9 Scores  PHQ - 2 Score 0 0 0 0 0 1 0  PHQ- 9 Score 0   0       Fall Risk     05/04/2023    8:52 AM 04/30/2023   10:53 AM 08/17/2022    9:13 AM 08/07/2022   10:08 AM 04/30/2022    1:11 PM  Fall Risk   Falls in the past year? 1 1 0 0 0  Number falls in past yr: 1 1 0 0 0  Injury with Fall? 0 0 0 0 0  Risk for fall due to : History of fall(s);Medication side effect History of fall(s) No Fall Risks No Fall Risks Medication side effect  Follow up Falls prevention discussed;Falls evaluation completed Falls evaluation completed Falls prevention discussed Falls evaluation completed Falls evaluation completed;Education provided;Falls prevention discussed    MEDICARE RISK AT HOME:  Medicare Risk at Home Any stairs in or around the home?: No If so, are there any without handrails?: No Home free of loose throw rugs in walkways, pet beds, electrical cords, etc?: Yes Adequate lighting in your home to reduce risk of falls?: Yes Life alert?: No Use of a cane, walker or w/c?: No Grab bars in the bathroom?: No Shower chair or bench in shower?: No Elevated toilet seat or a handicapped toilet?: No  TIMED UP AND GO:  Was the test performed?  No  Cognitive Function: 6CIT completed        05/04/2023    8:53 AM 05/01/2022    8:54  AM  6CIT Screen  What Year? 0 points 0 points  What month? 0 points 0 points  What time? 0 points 0 points  Count back from 20 0 points 0 points  Months in reverse 0 points 0 points  Repeat phrase 2 points 0 points  Total Score 2 points 0 points    Immunizations Immunization History  Administered Date(s) Administered   Fluad Quad(high Dose 65+) 10/25/2018, 11/20/2019, 12/07/2020    Fluad Trivalent(High Dose 65+) 01/08/2023   Influenza-Unspecified 10/30/2021   PFIZER(Purple Top)SARS-COV-2 Vaccination 06/07/2019, 06/26/2019   PNEUMOCOCCAL CONJUGATE-20 05/08/2022   Td 01/31/2016    Screening Tests Health Maintenance  Topic Date Due   COVID-19 Vaccine (3 - 2024-25 season) 05/16/2023 (Originally 10/01/2022)   Zoster Vaccines- Shingrix (1 of 2) 07/05/2023 (Originally 01/31/1995)   INFLUENZA VACCINE  08/31/2023   Medicare Annual Wellness (AWV)  05/03/2024   DTaP/Tdap/Td (2 - Tdap) 01/30/2026   Pneumonia Vaccine 72+ Years old  Completed   DEXA SCAN  Completed   Hepatitis C Screening  Completed   HPV VACCINES  Aged Out    Health Maintenance  There are no preventive care reminders to display for this patient.  Health Maintenance Items Addressed: Due for shingrix and covid vaccine.  Additional Screening:  Vision Screening: Recommended annual ophthalmology exams for early detection of glaucoma and other disorders of the eye.  Dental Screening: Recommended annual dental exams for proper oral hygiene  Community Resource Referral / Chronic Care Management: CRR required this visit?  No   CCM required this visit?  No     Plan:     I have personally reviewed and noted the following in the patient's chart:   Medical and social history Use of alcohol, tobacco or illicit drugs  Current medications and supplements including opioid prescriptions. Patient is not currently taking opioid prescriptions. Functional ability and status Nutritional status Physical activity Advanced directives List of other physicians Hospitalizations, surgeries, and ER visits in previous 12 months Vitals Screenings to include cognitive, depression, and falls Referrals and appointments  In addition, I have reviewed and discussed with patient certain preventive protocols, quality metrics, and best practice recommendations. A written personalized care plan for preventive services as well  as general preventive health recommendations were provided to patient.     Barb Merino, LPN   02/04/1094   After Visit Summary: (MyChart) Due to this being a telephonic visit, the after visit summary with patients personalized plan was offered to patient via MyChart   Notes: Nothing significant to report at this time.

## 2023-05-21 ENCOUNTER — Ambulatory Visit (INDEPENDENT_AMBULATORY_CARE_PROVIDER_SITE_OTHER): Admitting: Orthopaedic Surgery

## 2023-05-21 ENCOUNTER — Other Ambulatory Visit (INDEPENDENT_AMBULATORY_CARE_PROVIDER_SITE_OTHER): Payer: Self-pay

## 2023-05-21 VITALS — Ht 63.0 in | Wt 170.0 lb

## 2023-05-21 DIAGNOSIS — M25551 Pain in right hip: Secondary | ICD-10-CM

## 2023-05-21 NOTE — Progress Notes (Signed)
 The patient is a 78 year old female that I am seeing for the first time.  She is sent to me from Dr. Therese Flash due to right hip and groin pain has been slowly getting worse over the last few years.  It started about 2 years ago.  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  She says it is harder to put her shoes and socks on the right side and this is slowly getting worse.  I was able to review all of her past medical history and medications within epic.  She is not a diabetic.  She never had an injury but this is detrimentally affecting her mobility and her quality of life.  Her left hip moves smoothly and fluidly with no blocks to rotation and no pain at all.  The right hip has some stiffness with internal and external rotation as well as significant pain in the groin with rotation.  Standing AP pelvis and lateral the right hip are seen today and I compared these to x-rays from over a year ago.  There is superolateral joint space narrowing that is slightly worsened.  I talked her in length in detail about her hip arthritis.  I did give her hand about hip replacement surgery but do feel that she is a candidate for at least trying a one-time intra-articular steroid injection under fluoroscopic guidance in the right hip joint.  She absolutely would like to try this before considering any type of surgery.  We will work on getting her set up for appoint with Dr. Daisey Dryer and then he can get her back to us  about a month after that injection.  She agrees with this treatment plan.

## 2023-05-22 ENCOUNTER — Other Ambulatory Visit: Payer: Self-pay

## 2023-05-22 DIAGNOSIS — M25551 Pain in right hip: Secondary | ICD-10-CM

## 2023-06-01 ENCOUNTER — Other Ambulatory Visit: Payer: Self-pay | Admitting: Family Medicine

## 2023-06-01 DIAGNOSIS — M1611 Unilateral primary osteoarthritis, right hip: Secondary | ICD-10-CM

## 2023-06-05 ENCOUNTER — Other Ambulatory Visit: Payer: Self-pay

## 2023-06-05 ENCOUNTER — Ambulatory Visit (INDEPENDENT_AMBULATORY_CARE_PROVIDER_SITE_OTHER): Admitting: Physical Medicine and Rehabilitation

## 2023-06-05 DIAGNOSIS — M25551 Pain in right hip: Secondary | ICD-10-CM | POA: Diagnosis not present

## 2023-06-05 NOTE — Progress Notes (Signed)
   Lawona Hagler - 78 y.o. female MRN [id number]  Date of birth: [birth year]  Office Visit Note: Visit Date: 06/05/2023 PCP: Graig Lawyer, MD Referred by: Graig Lawyer, MD  Subjective: Chief Complaint  Patient presents with   Right Hip - Pain   HPI:  Clorice Debruler is a 78 y.o. female who comes in today at the request of Dr. Norberto Bear for planned Right anesthetic hip arthrogram with fluoroscopic guidance.  The patient has failed conservative care including home exercise, medications, time and activity modification.  This injection will be diagnostic and hopefully therapeutic.  Please see requesting physician notes for further details and justification.   ROS Otherwise per HPI.  Assessment & Plan: Visit Diagnoses:    ICD-10-CM   1. Pain of right hip  M25.551 Large Joint Inj: R hip joint    XR C-ARM NO REPORT      Plan: No additional findings.   Meds & Orders: No orders of the defined types were placed in this encounter.   Orders Placed This Encounter  Procedures   Large Joint Inj: R hip joint   XR C-ARM NO REPORT    Follow-up: Return in about 4 weeks (around 07/03/2023) for Norberto Bear, MD.   Procedures: Large Joint Inj: R hip joint on 06/05/2023 2:49 PM Indications: diagnostic evaluation and pain Details: 22 G 3.5 in needle, fluoroscopy-guided anterior approach  Arthrogram: No  Medications: 4 mL bupivacaine 0.25 %; 40 mg triamcinolone acetonide 40 MG/ML Outcome: tolerated well, no immediate complications  There was excellent flow of contrast producing a partial arthrogram of the hip. The patient did have relief of symptoms during the anesthetic phase of the injection. Procedure, treatment alternatives, risks and benefits explained, specific risks discussed. Consent was given by the patient. Immediately prior to procedure a time out was called to verify the correct patient, procedure, equipment, support staff and site/side marked as required. Patient was  prepped and draped in the usual sterile fashion.          Clinical History: No specialty comments available.     Objective:  VS:  HT:    WT:   BMI:     BP:   HR: bpm  TEMP: ( )  RESP:  Physical Exam   Imaging: No results found.

## 2023-06-05 NOTE — Progress Notes (Signed)
 Pain Scale   Average Pain 6 Patient advising her right hip is painful constantly without relief.        +Driver, -BT, -Dye Allergies.

## 2023-06-11 MED ORDER — BUPIVACAINE HCL 0.25 % IJ SOLN
4.0000 mL | INTRAMUSCULAR | Status: AC | PRN
Start: 2023-06-05 — End: 2023-06-05
  Administered 2023-06-05: 4 mL via INTRA_ARTICULAR

## 2023-06-11 MED ORDER — TRIAMCINOLONE ACETONIDE 40 MG/ML IJ SUSP
40.0000 mg | INTRAMUSCULAR | Status: AC | PRN
Start: 2023-06-05 — End: 2023-06-05
  Administered 2023-06-05: 40 mg via INTRA_ARTICULAR

## 2023-06-30 ENCOUNTER — Other Ambulatory Visit: Payer: Self-pay | Admitting: Nurse Practitioner

## 2023-06-30 DIAGNOSIS — M1611 Unilateral primary osteoarthritis, right hip: Secondary | ICD-10-CM

## 2023-07-09 ENCOUNTER — Ambulatory Visit (INDEPENDENT_AMBULATORY_CARE_PROVIDER_SITE_OTHER): Admitting: Orthopaedic Surgery

## 2023-07-09 ENCOUNTER — Encounter: Payer: Self-pay | Admitting: Orthopaedic Surgery

## 2023-07-09 DIAGNOSIS — M25551 Pain in right hip: Secondary | ICD-10-CM

## 2023-07-09 DIAGNOSIS — M1611 Unilateral primary osteoarthritis, right hip: Secondary | ICD-10-CM | POA: Diagnosis not present

## 2023-07-09 NOTE — Progress Notes (Signed)
 HPI: Cheryl Yu comes in today follow-up status post right hip injection by Dr. Daisey Dryer on 06/05/2023.  She states she is better but not 100%.  She states she is about 50 to 60% better.  She does not really have pain in the groin just has some discomfort.  She feels that her lower back is mainly a problem at this point in time but does not want this worked up.  She notes that she is able to don shoes and socks easier on the right side than she was before.  Review of systems: See HPI.  Right hip: Good range of motion of the right hip without pain.  Ambulates without any assistive device.  Impression: Right hip arthritis  Plan: This point in time she understands to wait least 3 months between injections right hip.  In regards to the back offered workup she deferred at this point in time.  Therefore back exercise handouts were given and reviewed.  She will follow-up with us  as needed.  Questions were encouraged and answered by Dr. Lucienne Ryder and myself.

## 2023-07-23 ENCOUNTER — Ambulatory Visit: Payer: Self-pay

## 2023-07-23 ENCOUNTER — Telehealth: Payer: Self-pay | Admitting: Family Medicine

## 2023-07-23 NOTE — Telephone Encounter (Signed)
 Reason for Disposition  Dizziness caused by sudden or prolonged standing  Answer Assessment - Initial Assessment Questions Pt reports new episodes of dizziness since last week. Pt reports she feels stable and if anything changes or feels worse like an urgency, she knows to call MD or ED for further assistance. Pt already has PCP appmt for July 10th and states lives with her daughter and granddaughter so they help watch her, as well. Nurse also advise pt to make sure to continue to get up slowly and watch herself for dizzy spells and to make sure to stay hydrated too.  1. DESCRIPTION: Describe your dizziness.     Upon standing or has sat for too long, mild dizziness upon getting up 2. LIGHTHEADED: Do you feel lightheaded? (e.g., somewhat faint, woozy, weak upon standing)     Upon getting up too fast pt reports  3. VERTIGO: Do you feel like either you or the room is spinning or tilting? (i.e. vertigo)     no 4. SEVERITY: How bad is it?  Do you feel like you are going to faint? Can you stand and walk?   - MILD: Feels slightly dizzy, but walking normally.   - MODERATE: Feels unsteady when walking, but not falling; interferes with normal activities (e.g., school, work).   - SEVERE: Unable to walk without falling, or requires assistance to walk without falling; feels like passing out now.      Mild right now 5. ONSET:  When did the dizziness begin?     Last week 6. AGGRAVATING FACTORS: Does anything make it worse? (e.g., standing, change in head position)     Standing up in AM or when sitting too long 8. CAUSE: What do you think is causing the dizziness?     unknown 9. RECURRENT SYMPTOM: Have you had dizziness before? If Yes, ask: When was the last time? What happened that time?     Syncope long time ago 10. OTHER SYMPTOMS: Do you have any other symptoms? 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  Protocols used: Dizziness - Lightheadedness-A-AH

## 2023-07-23 NOTE — Telephone Encounter (Signed)
 Spoke to patient and she states she doesn't want to come in sooner then scheduled due to her daughter also has an appointment that day.  Advised her to please be careful getting up and if it gets worse to call and get an earlier appointment. SABRADm/cma

## 2023-07-23 NOTE — Telephone Encounter (Signed)
 Patient has an appointment made for 08/09/23.

## 2023-07-23 NOTE — Telephone Encounter (Incomplete)
 FYI Only or Action Required?: {FYI only or action required:32865}  Patient was last seen in primary care on 04/30/2023 by Thedora Garnette HERO, MD. Called Nurse Triage reporting Dizziness. Symptoms began a week ago. Interventions attempted: Rest, hydration, or home remedies. Symptoms are: unchanged.  Triage Disposition: Home Care  Patient/caregiver understands and will follow disposition?: Yes

## 2023-07-23 NOTE — Telephone Encounter (Signed)
 FYI: This call has been transferred to triage nurse: the Triage Nurse. Once the result note has been entered staff can address the message at that time.  Patient called in with the following symptoms:  Red Word:dizziness , pt scheduled appt via mychart for Dizzy & anxiety. I spoke with pt, she said she is having a sharp pain in her forehead off and on for the past week. I transferred her over to nurse triage.    Please advise at [facility] [phone]  Message is routed to Provider Pool.

## 2023-08-07 ENCOUNTER — Encounter: Payer: Self-pay | Admitting: Family Medicine

## 2023-08-09 ENCOUNTER — Encounter: Payer: Self-pay | Admitting: Family Medicine

## 2023-08-09 ENCOUNTER — Ambulatory Visit (INDEPENDENT_AMBULATORY_CARE_PROVIDER_SITE_OTHER): Admitting: Family Medicine

## 2023-08-09 VITALS — BP 132/76 | HR 67 | Temp 96.9°F | Wt 175.4 lb

## 2023-08-09 DIAGNOSIS — F430 Acute stress reaction: Secondary | ICD-10-CM | POA: Diagnosis not present

## 2023-08-09 DIAGNOSIS — R42 Dizziness and giddiness: Secondary | ICD-10-CM | POA: Diagnosis not present

## 2023-08-09 NOTE — Assessment & Plan Note (Signed)
 We discussed the significant change she is facing and her concerns. I recommend she be mindful of her stress and discussed approaches to relieving this when she feels overwhelmed. I expressed regret at losing her as a patient nd offered my best wishes for her.

## 2023-08-09 NOTE — Assessment & Plan Note (Signed)
 Vital signs and cardiac exam are normal. I agree that this may be some relative dehydration and support her ongoing efforts at adequate hydration. Stress may have played a role.

## 2023-08-09 NOTE — Progress Notes (Signed)
 [facility] PRIMARY CARE [facility] [address] [place] [address] Dept: [phone] Dept Fax: [phone]  Office Visit  Subjective:    Patient ID: Cheryl Yu, female    DOB: [birth year], 78 y.o..   MRN: [id number]  Chief Complaint  Patient presents with   Dizziness    C/o having some dizziness x 2 weeks off/on.      History of Present Illness:  Patient is in today complaining of lightheadedness upon standing over the past 2 weeks. The episodes are brief and have not been associated with any falls. She feels this is likely due to stress and some relative dehydration. she has increased her fluid intake and feels this has improved some. Her stressors relate to family issues. Her grandson has accepted a new job and will be moving his family to [place]. Cheryl Yu does not feel that she can continue to afford rent where she currently lives. She has decided to buy a travel trailer and move to a campground in [place], [place]. This will put her about halfway between two of her grandchildren ([place] and [place]). She is not happy about the circumstances as she has liked living here in [place].  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   Osteoporosis without current pathological fracture 12/07/2020   Gastroesophageal reflux disease without esophagitis 12/07/2020   Generalized anxiety disorder 12/07/2020   Past Surgical History:  Procedure Laterality Date   ABDOMINAL HYSTERECTOMY     Took out Uterus   BREAST ENHANCEMENT SURGERY  1990   silicone   CHOLECYSTECTOMY  1977   Open   complete bladder reconstruction  2019   COSMETIC SURGERY   Breast augmentation   TRABECULECTOMY     Family History  Problem Relation Age of Onset   Arthritis Mother    Cancer Mother        Ovarian   Hearing loss Mother    Stroke Mother    COPD Father    Cancer Father    Cancer Sister        Pancreatic   Early death Sister    Kidney disease Sister    Arthritis Maternal Grandmother    Heart disease Maternal Grandmother    Cancer Maternal Grandfather    Early death Maternal Grandfather    Depression Daughter    Alcohol abuse Son    Depression Son    Diabetes Son    Drug abuse Son    Sleep apnea Neg Hx    Outpatient Medications Prior to Visit  Medication Sig Dispense Refill   albuterol  (VENTOLIN  HFA) 108 (90 Base) MCG/ACT inhaler Inhale 2 puffs into the lungs every 4 (four) hours as needed for wheezing or shortness of breath. 18 each 1   ASPIRIN 81 PO Take 325 mg by mouth daily. (Patient taking differently: Take 81 mg by mouth daily.)     cholecalciferol (VITAMIN D3) 25 MCG (1000 UNIT) tablet Take 1,000 Units by mouth daily.     Multiple Vitamin (MULTIVITAMIN) tablet Take 1 tablet by mouth daily.     omeprazole  (PRILOSEC) 20 MG capsule TAKE 1 CAPSULE BY MOUTH EVERY DAY 90 capsule 3   rOPINIRole  (REQUIP ) 2 MG tablet  Take 1 tablet (2 mg total) by mouth in the morning and at bedtime. 180 tablet 1   venlafaxine  XR (EFFEXOR  XR) 75 MG 24 hr capsule Take 1 capsule (75 mg total) by mouth daily with breakfast. 90 capsule 3   naproxen  (NAPROSYN ) 500 MG tablet TAKE 1 TABLET BY MOUTH 2 TIMES DAILY WITH A MEAL. (Patient not taking: Reported on 08/09/2023) 60 tablet 0   No facility-administered medications prior to visit.   Allergies  Allergen Reactions   Codeine Other (See Comments)   Morphine Other (See Comments)     Objective:   Today's Vitals   08/09/23 1031  BP: 132/76  Pulse: 67  Temp: (!) 96.9 F (36.1 C)  TempSrc: Temporal  SpO2: 98%  Weight: 175 lb 6.4 oz (79.6 kg)   Body mass index is 31.07 kg/m.   General: Well developed,  well nourished. No acute distress. CV: RRR without murmurs or rubs. Pulses 2+ bilaterally. Psych: Alert and oriented. Normal mood and affect.  Health Maintenance Due  Topic Date Due   Zoster Vaccines- Shingrix (2 of 2) 07/16/2023     Assessment & Plan:   Problem List Items Addressed This Visit       Other   Acute reaction to situational stress   We discussed the significant change she is facing and her concerns. I recommend she be mindful of her stress and discussed approaches to relieving this when she feels overwhelmed. I expressed regret at losing her as a patient nd offered my best wishes for her.      Lightheadedness - Primary   Vital signs and cardiac exam are normal. I agree that this may be some relative dehydration and support her ongoing efforts at adequate hydration. Stress may have played a role.       Return if symptoms worsen or fail to improve.   Garnette CHRISTELLA Simpler, MD

## 2023-10-21 ENCOUNTER — Other Ambulatory Visit: Payer: Self-pay | Admitting: Family Medicine

## 2023-10-21 DIAGNOSIS — G2581 Restless legs syndrome: Secondary | ICD-10-CM

## 2023-12-03 ENCOUNTER — Encounter: Payer: Self-pay | Admitting: Radiology

## 2024-01-12 ENCOUNTER — Other Ambulatory Visit: Payer: Self-pay | Admitting: Family Medicine

## 2024-01-12 DIAGNOSIS — F411 Generalized anxiety disorder: Secondary | ICD-10-CM

## 2024-01-14 NOTE — Telephone Encounter (Signed)
 Left FM to RTN call. Dm/cma

## 2024-01-21 NOTE — Telephone Encounter (Signed)
Left VM to RTN call.  Dm/cma

## 2024-02-15 ENCOUNTER — Other Ambulatory Visit: Payer: Self-pay | Admitting: Family Medicine

## 2024-02-15 DIAGNOSIS — K219 Gastro-esophageal reflux disease without esophagitis: Secondary | ICD-10-CM

## 2024-05-05 ENCOUNTER — Ambulatory Visit
